# Patient Record
Sex: Male | Born: 1990 | Race: Black or African American | Hispanic: No | Smoking: Light tobacco smoker
Health system: Southern US, Community
[De-identification: ages and names within clinical notes are randomized; demographics above are authoritative.]

## PROBLEM LIST (undated history)

## (undated) DIAGNOSIS — J45909 Unspecified asthma, uncomplicated: Secondary | ICD-10-CM

## (undated) DIAGNOSIS — F209 Schizophrenia, unspecified: Secondary | ICD-10-CM

## (undated) DIAGNOSIS — F319 Bipolar disorder, unspecified: Secondary | ICD-10-CM

## (undated) DIAGNOSIS — F909 Attention-deficit hyperactivity disorder, unspecified type: Secondary | ICD-10-CM

## (undated) DIAGNOSIS — R569 Unspecified convulsions: Secondary | ICD-10-CM

## (undated) DIAGNOSIS — F329 Major depressive disorder, single episode, unspecified: Secondary | ICD-10-CM

## (undated) DIAGNOSIS — F32A Depression, unspecified: Secondary | ICD-10-CM

## (undated) DIAGNOSIS — R454 Irritability and anger: Secondary | ICD-10-CM

## (undated) HISTORY — PX: ABDOMINAL SURGERY: SHX537

## (undated) HISTORY — PX: NISSEN FUNDOPLICATION: SHX2091

## (undated) HISTORY — PX: STOMACH SURGERY: SHX791

## (undated) HISTORY — PX: TRACHEOSTOMY: SUR1362

---
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
## 2008-08-11 ENCOUNTER — Emergency Department (HOSPITAL_COMMUNITY): Admission: EM | Admit: 2008-08-11 | Discharge: 2008-08-11 | Payer: Self-pay | Admitting: Emergency Medicine

## 2008-11-22 ENCOUNTER — Ambulatory Visit (HOSPITAL_COMMUNITY): Admission: EM | Admit: 2008-11-22 | Discharge: 2008-11-22 | Payer: Self-pay | Admitting: *Deleted

## 2009-03-17 ENCOUNTER — Emergency Department (HOSPITAL_COMMUNITY): Admission: EM | Admit: 2009-03-17 | Discharge: 2009-03-17 | Payer: Self-pay | Admitting: Emergency Medicine

## 2009-06-16 ENCOUNTER — Ambulatory Visit: Payer: Self-pay | Admitting: Radiology

## 2009-06-16 ENCOUNTER — Emergency Department (HOSPITAL_BASED_OUTPATIENT_CLINIC_OR_DEPARTMENT_OTHER): Admission: EM | Admit: 2009-06-16 | Discharge: 2009-06-16 | Payer: Self-pay | Admitting: Emergency Medicine

## 2009-07-12 ENCOUNTER — Emergency Department (HOSPITAL_COMMUNITY): Admission: EM | Admit: 2009-07-12 | Discharge: 2009-07-12 | Payer: Self-pay | Admitting: Emergency Medicine

## 2009-07-24 ENCOUNTER — Emergency Department (HOSPITAL_COMMUNITY): Admission: EM | Admit: 2009-07-24 | Discharge: 2009-07-25 | Payer: Self-pay | Admitting: Emergency Medicine

## 2009-07-25 ENCOUNTER — Emergency Department (HOSPITAL_COMMUNITY): Admission: EM | Admit: 2009-07-25 | Discharge: 2009-07-26 | Payer: Self-pay | Admitting: Emergency Medicine

## 2009-07-26 ENCOUNTER — Observation Stay (HOSPITAL_COMMUNITY): Admission: EM | Admit: 2009-07-26 | Discharge: 2009-07-28 | Payer: Self-pay | Admitting: Emergency Medicine

## 2009-07-26 ENCOUNTER — Ambulatory Visit: Payer: Self-pay | Admitting: Internal Medicine

## 2009-08-01 ENCOUNTER — Emergency Department (HOSPITAL_COMMUNITY): Admission: EM | Admit: 2009-08-01 | Discharge: 2009-08-01 | Payer: Self-pay | Admitting: Emergency Medicine

## 2009-08-24 ENCOUNTER — Emergency Department (HOSPITAL_COMMUNITY): Admission: EM | Admit: 2009-08-24 | Discharge: 2009-08-24 | Payer: Self-pay | Admitting: Emergency Medicine

## 2009-10-31 ENCOUNTER — Inpatient Hospital Stay (HOSPITAL_COMMUNITY): Admission: EM | Admit: 2009-10-31 | Discharge: 2009-11-02 | Payer: Self-pay | Admitting: Emergency Medicine

## 2010-09-26 LAB — CBC
HCT: 41.1 % (ref 39.0–52.0)
HCT: 39.8 % (ref 39.0–52.0)
HCT: 36.6 % — ABNORMAL LOW (ref 39.0–52.0)
Hemoglobin: 13.5 g/dL (ref 13.0–17.0)
Hemoglobin: 12.9 g/dL — ABNORMAL LOW (ref 13.0–17.0)
Hemoglobin: 12.9 g/dL — ABNORMAL LOW (ref 13.0–17.0)
MCHC: 32.9 g/dL (ref 30.0–36.0)
MCHC: 32.6 g/dL (ref 30.0–36.0)
MCHC: 33.7 g/dL (ref 30.0–36.0)
MCV: 84.2 fL (ref 78.0–100.0)
MCV: 83.8 fL (ref 78.0–100.0)
MCV: 84.9 fL (ref 78.0–100.0)
Platelets: 203 10*3/uL (ref 150–400)
Platelets: 216 10*3/uL (ref 150–400)
RBC: 4.88 MIL/uL (ref 4.22–5.81)
RDW: 15.2 % (ref 11.5–15.5)
RDW: 15.3 % (ref 11.5–15.5)
RDW: 15.3 % (ref 11.5–15.5)
RDW: 15.5 % (ref 11.5–15.5)
WBC: 7.4 10*3/uL (ref 4.0–10.5)
WBC: 6.7 10*3/uL (ref 4.0–10.5)

## 2010-09-26 LAB — BASIC METABOLIC PANEL
BUN: 14 mg/dL (ref 6–23)
BUN: 8 mg/dL (ref 6–23)
CO2: 26 mEq/L (ref 19–32)
CO2: 25 mEq/L (ref 19–32)
Calcium: 9.4 mg/dL (ref 8.4–10.5)
Calcium: 8.9 mg/dL (ref 8.4–10.5)
Calcium: 9 mg/dL (ref 8.4–10.5)
Chloride: 107 mEq/L (ref 96–112)
Chloride: 104 mEq/L (ref 96–112)
Creatinine, Ser: 0.9 mg/dL (ref 0.4–1.5)
Creatinine, Ser: 0.99 mg/dL (ref 0.4–1.5)
GFR calc Af Amer: >60 mL/min (ref >60)
GFR calc Af Amer: >60 mL/min (ref >60)
GFR calc non Af Amer: >60 mL/min (ref >60)
Glucose, Bld: 91 mg/dL (ref 70–99)
Glucose, Bld: 95 mg/dL (ref 70–99)
Sodium: 141 mEq/L (ref 135–145)
Sodium: 139 mEq/L (ref 135–145)

## 2010-09-26 LAB — ETHANOL: Alcohol, Ethyl (B): 20 mg/dL — ABNORMAL HIGH (ref 0–10)

## 2010-09-26 LAB — HEMOCCULT GUIAC POC 1CARD (OFFICE)
Fecal Occult Bld: NEGATIVE
Fecal Occult Bld: POSITIVE
Fecal Occult Bld: NEGATIVE

## 2010-09-26 LAB — DIFFERENTIAL
Basophils Absolute: 0 10*3/uL (ref 0.0–0.1)
Basophils Absolute: 0 10*3/uL (ref 0.0–0.1)
Basophils Absolute: 0 10*3/uL (ref 0.0–0.1)
Basophils Relative: 0 % (ref 0–1)
Basophils Relative: 0 % (ref 0–1)
Basophils Relative: 0 % (ref 0–1)
Eosinophils Absolute: 0 10*3/uL (ref 0.0–0.7)
Eosinophils Absolute: 0 10*3/uL (ref 0.0–0.7)
Eosinophils Relative: 0 % (ref 0–5)
Monocytes Absolute: 0.9 10*3/uL (ref 0.1–1.0)
Monocytes Absolute: 0.5 10*3/uL (ref 0.1–1.0)
Monocytes Absolute: 0.7 10*3/uL (ref 0.1–1.0)
Monocytes Relative: 10 % (ref 3–12)
Monocytes Relative: 7 % (ref 3–12)
Neutro Abs: 4.7 10*3/uL (ref 1.7–7.7)
Neutrophils Relative %: 72 % (ref 43–77)

## 2010-09-26 LAB — RAPID URINE DRUG SCREEN, HOSP PERFORMED
Barbiturates: POSITIVE — AB
Benzodiazepines: NOT DETECTED
Cocaine: NOT DETECTED
Opiates: NOT DETECTED
Opiates: POSITIVE — AB
Tetrahydrocannabinol: POSITIVE — AB

## 2010-09-26 LAB — HEPATIC FUNCTION PANEL
AST: 16 U/L (ref 0–37)
Albumin: 3.3 g/dL — ABNORMAL LOW (ref 3.5–5.2)
Total Protein: 5.9 g/dL — ABNORMAL LOW (ref 6.0–8.3)

## 2010-09-26 LAB — HIV ANTIBODY (ROUTINE TESTING W REFLEX): HIV: NONREACTIVE

## 2010-09-26 LAB — COMPREHENSIVE METABOLIC PANEL
ALT: 11 U/L (ref 0–53)
Albumin: 3.7 g/dL (ref 3.5–5.2)
Alkaline Phosphatase: 65 U/L (ref 39–117)
Chloride: 105 mEq/L (ref 96–112)
Glucose, Bld: 85 mg/dL (ref 70–99)
Potassium: 3.6 mEq/L (ref 3.5–5.1)
Sodium: 138 mEq/L (ref 135–145)
Total Bilirubin: 0.5 mg/dL (ref 0.3–1.2)
Total Protein: 6.7 g/dL (ref 6.0–8.3)

## 2010-09-26 LAB — POCT I-STAT, CHEM 8
BUN: 10 mg/dL (ref 6–23)
BUN: 16 mg/dL (ref 6–23)
Calcium, Ion: 1.16 mmol/L (ref 1.12–1.32)
Chloride: 105 mEq/L (ref 96–112)
Chloride: 107 mEq/L (ref 96–112)
Creatinine, Ser: 0.9 mg/dL (ref 0.4–1.5)
Creatinine, Ser: 1.1 mg/dL (ref 0.4–1.5)
Potassium: 3.6 mEq/L (ref 3.5–5.1)
Sodium: 140 mEq/L (ref 135–145)
Sodium: 141 mEq/L (ref 135–145)
TCO2: 25 mmol/L (ref 0–100)
TCO2: 30 mmol/L (ref 0–100)

## 2010-09-26 LAB — URINALYSIS, ROUTINE W REFLEX MICROSCOPIC
Bilirubin Urine: NEGATIVE
Bilirubin Urine: NEGATIVE
Glucose, UA: NEGATIVE mg/dL
Hgb urine dipstick: NEGATIVE
Ketones, ur: NEGATIVE mg/dL
Ketones, ur: NEGATIVE mg/dL
Nitrite: NEGATIVE
Protein, ur: NEGATIVE mg/dL
Specific Gravity, Urine: 1.039 — ABNORMAL HIGH (ref 1.005–1.030)
Urobilinogen, UA: 1 mg/dL (ref 0.0–1.0)

## 2010-09-26 LAB — PROTIME-INR
Prothrombin Time: 12.9 seconds (ref 11.6–15.2)
Prothrombin Time: 14.4 seconds (ref 11.6–15.2)

## 2010-09-26 LAB — LIPASE, BLOOD: Lipase: 13 U/L (ref 11–59)

## 2010-09-26 LAB — URINE MICROSCOPIC-ADD ON

## 2010-09-26 LAB — HEPATITIS B SURFACE ANTIGEN: Hepatitis B Surface Ag: NEGATIVE

## 2010-09-28 LAB — CBC
HCT: 39.3 % (ref 39.0–52.0)
Hemoglobin: 12.4 g/dL — ABNORMAL LOW (ref 13.0–17.0)
Hemoglobin: 14 g/dL (ref 13.0–17.0)
Hemoglobin: 14.1 g/dL (ref 13.0–17.0)
Platelets: 243 10*3/uL (ref 150–400)
RBC: 4.4 MIL/uL (ref 4.22–5.81)
RBC: 4.95 MIL/uL (ref 4.22–5.81)
RDW: 14.5 % (ref 11.5–15.5)
RDW: 14.7 % (ref 11.5–15.5)
RDW: 14.8 % (ref 11.5–15.5)
WBC: 10.1 10*3/uL (ref 4.0–10.5)
WBC: 11.5 10*3/uL — ABNORMAL HIGH (ref 4.0–10.5)

## 2010-09-28 LAB — RAPID URINE DRUG SCREEN, HOSP PERFORMED
Amphetamines: NOT DETECTED
Cocaine: NOT DETECTED
Opiates: NOT DETECTED
Tetrahydrocannabinol: POSITIVE — AB

## 2010-09-28 LAB — DIFFERENTIAL
Basophils Absolute: 0 10*3/uL (ref 0.0–0.1)
Lymphocytes Relative: 26 % (ref 12–46)
Monocytes Absolute: 0.6 10*3/uL (ref 0.1–1.0)
Neutro Abs: 4.2 10*3/uL (ref 1.7–7.7)

## 2010-09-28 LAB — HEMOGLOBIN AND HEMATOCRIT, BLOOD
HCT: 41.8 % (ref 39.0–52.0)
Hemoglobin: 13.9 g/dL (ref 13.0–17.0)

## 2010-09-28 LAB — POCT I-STAT, CHEM 8
BUN: 8 mg/dL (ref 6–23)
Calcium, Ion: 1.07 mmol/L — ABNORMAL LOW (ref 1.12–1.32)
Creatinine, Ser: 0.9 mg/dL (ref 0.4–1.5)
TCO2: 23 mmol/L (ref 0–100)

## 2010-09-28 LAB — CROSSMATCH: ABO/RH(D): A POS

## 2010-09-28 LAB — ABO/RH: ABO/RH(D): A POS

## 2010-09-28 LAB — PROTIME-INR: INR: 1.05 (ref 0.00–1.49)

## 2010-09-28 LAB — APTT: aPTT: 25 seconds (ref 24–37)

## 2010-10-12 LAB — COMPREHENSIVE METABOLIC PANEL WITH GFR
AST: 49 U/L — ABNORMAL HIGH (ref 0–37)
Albumin: 3.9 g/dL (ref 3.5–5.2)
BUN: 10 mg/dL (ref 6–23)
Creatinine, Ser: 1.1 mg/dL (ref 0.4–1.5)
GFR calc Af Amer: >60 mL/min (ref >60)
Total Protein: 6.6 g/dL (ref 6.0–8.3)

## 2010-10-12 LAB — COMPREHENSIVE METABOLIC PANEL
ALT: 18 U/L (ref 0–53)
Alkaline Phosphatase: 64 U/L (ref 39–117)
CO2: 30 mEq/L (ref 19–32)
Calcium: 9.1 mg/dL (ref 8.4–10.5)
Chloride: 106 mEq/L (ref 96–112)
GFR calc non Af Amer: >60 mL/min (ref >60)
Glucose, Bld: 76 mg/dL (ref 70–99)
Potassium: 4.4 mEq/L (ref 3.5–5.1)
Sodium: 143 mEq/L (ref 135–145)
Total Bilirubin: 0.4 mg/dL (ref 0.3–1.2)

## 2010-10-12 LAB — CBC
HCT: 43.5 % (ref 39.0–52.0)
Hemoglobin: 14.1 g/dL (ref 13.0–17.0)
MCHC: 32.4 g/dL (ref 30.0–36.0)
MCV: 83.5 fL (ref 78.0–100.0)
Platelets: 288 K/uL (ref 150–400)
RBC: 5.21 MIL/uL (ref 4.22–5.81)
RDW: 15.7 % — ABNORMAL HIGH (ref 11.5–15.5)
WBC: 9 K/uL (ref 4.0–10.5)

## 2010-10-12 LAB — LIPASE, BLOOD: Lipase: 36 U/L (ref 23–300)

## 2010-10-12 LAB — HEMOCCULT GUIAC POC 1CARD (OFFICE): Fecal Occult Bld: NEGATIVE

## 2010-10-15 LAB — URINALYSIS, ROUTINE W REFLEX MICROSCOPIC
Bilirubin Urine: NEGATIVE
Hgb urine dipstick: NEGATIVE
Ketones, ur: NEGATIVE mg/dL
Specific Gravity, Urine: 1.016 (ref 1.005–1.030)
Urobilinogen, UA: 0.2 mg/dL (ref 0.0–1.0)
pH: 7.5 (ref 5.0–8.0)

## 2010-10-15 LAB — URINE MICROSCOPIC-ADD ON

## 2010-10-19 LAB — COMPREHENSIVE METABOLIC PANEL
ALT: 15 U/L (ref 0–53)
AST: 21 U/L (ref 0–37)
Alkaline Phosphatase: 75 U/L (ref 39–117)
CO2: 27 mEq/L (ref 19–32)
Chloride: 106 mEq/L (ref 96–112)
GFR calc Af Amer: >60 mL/min (ref >60)
GFR calc non Af Amer: >60 mL/min (ref >60)
Sodium: 139 mEq/L (ref 135–145)
Total Bilirubin: 0.5 mg/dL (ref 0.3–1.2)

## 2010-10-19 LAB — LIPASE, BLOOD: Lipase: 15 U/L (ref 11–59)

## 2010-10-19 LAB — DIFFERENTIAL
Basophils Absolute: 0 10*3/uL (ref 0.0–0.1)
Eosinophils Absolute: 0 10*3/uL (ref 0.0–0.7)
Eosinophils Relative: 0 % (ref 0–5)

## 2010-10-19 LAB — CBC
RBC: 4.64 MIL/uL (ref 4.22–5.81)
WBC: 8.5 10*3/uL (ref 4.0–10.5)

## 2010-10-26 LAB — CBC
HCT: 45.4 % (ref 39.0–52.0)
MCHC: 32.4 g/dL (ref 30.0–36.0)
MCV: 85 fL (ref 78.0–100.0)
Platelets: 249 10*3/uL (ref 150–400)
RDW: 14.9 % (ref 11.5–15.5)

## 2010-10-26 LAB — COMPREHENSIVE METABOLIC PANEL
Albumin: 3.9 g/dL (ref 3.5–5.2)
BUN: 9 mg/dL (ref 6–23)
Calcium: 9.3 mg/dL (ref 8.4–10.5)
Creatinine, Ser: 0.99 mg/dL (ref 0.4–1.5)
Potassium: 3.9 mEq/L (ref 3.5–5.1)
Total Protein: 6.5 g/dL (ref 6.0–8.3)

## 2010-10-26 LAB — URINALYSIS, ROUTINE W REFLEX MICROSCOPIC
Nitrite: NEGATIVE
Urobilinogen, UA: 1 mg/dL (ref 0.0–1.0)
pH: 5.5 (ref 5.0–8.0)

## 2010-10-26 LAB — DIFFERENTIAL
Lymphocytes Relative: 9 % — ABNORMAL LOW (ref 12–46)
Lymphs Abs: 0.9 10*3/uL (ref 0.7–4.0)
Monocytes Absolute: 0.4 10*3/uL (ref 0.1–1.0)
Monocytes Relative: 4 % (ref 3–12)
Neutro Abs: 7.8 10*3/uL — ABNORMAL HIGH (ref 1.7–7.7)

## 2010-10-26 LAB — ETHANOL: Alcohol, Ethyl (B): <5 mg/dL (ref 0–10)

## 2010-10-26 LAB — RAPID URINE DRUG SCREEN, HOSP PERFORMED
Benzodiazepines: NOT DETECTED
Cocaine: NOT DETECTED

## 2010-10-26 LAB — URINE MICROSCOPIC-ADD ON
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
## 2010-11-23 NOTE — H&P (Signed)
NAME:  Jay Moran, PANNING.:  [id number]   MEDICAL RECORD NO.:  [id number]          PATIENT TYPE:  EMS   LOCATION:  ED                           FACILITY[facility]  [facility]   PHYSICIAN:  Richarda Overlie, MD       DATE OF BIRTH:  14-Oct-1990   DATE OF ADMISSION:  08/11/2008  DATE OF DISCHARGE:                              HISTORY & PHYSICAL   SUBJECTIVE:  This is a 20 year old male who presents to the ER with a  chief complaint of abdominal pain.  The patient has remote history of  gastroesophageal reflux disease with Nissen fundoplication at [facility] when he was about 35 months old.  No records are available at this  point for review.  However, the records have been requested.  The  patient presented to the ER today with a chief complaint of epigastric  abdominal pain for the last 24 hours, spasmodic in nature, associated  with recurrent nausea and vomiting.  The patient states that he has had  at least 8-12 episodes of vomiting.  Two of those episodes were coffee-  ground emesis.  The patient also felt somewhat dizzy this morning but no  episodes of syncope or presyncope noted according to the family.  The  patient has not noticed any diarrhea or constipation, no fever, no blood  in this stool, no black tarry stools.  He denies any chest pain,  shortness of breath or wheezing, and his asthma has been under control.  He has a history of alcohol abuse and narcotic dependence.  However,  patient denies any history of recent alcohol use and states that he has  been sober for the last several months.  He denies any history of  urinary urgency, frequency or dysuria.  He denies any recent weight loss  or weight gain.   PAST MEDICAL HISTORY:  1. Asthma.  2. History of respiratory failure.   PAST SURGICAL HISTORY:  1. History of Nissen fundoplication about 18 years ago at [facility].   SOCIAL HISTORY:  History of alcohol abuse.  Patient states he has been  sober  for the last week.   ALLERGIES:  No known drug allergies.   HOME MEDICATIONS:  None.   REVIEW OF SYSTEMS:  Positive for epigastric pain, spasmodic in nature,  with 2 episodes of coffee-ground emesis and some dizziness.   PHYSICAL EXAMINATION:  VITAL SIGNS:  Blood pressure 144/59, pulse of 81,  respirations 20, and 100% on room air.  HEENT:  Pupils are equal and reactive.  Extraocular movements are  intact.  LUNGS:  Clear to auscultation bilaterally, no wheezes, no crackles, no  rhonchi.  CARDIOVASCULAR:  Regular rate and rhythm.  NECK:  Supple with full range of motion without any JVD.  ABDOMEN:  Midline incision which is well healed with some epigastric  tenderness, hypoactive bowel sounds without any rebound or guarding.  EXTREMITIES:  Normal.  NEUROLOGIC:  Cranial nerves II-XII are grossly intact, motor intact in  all 4 extremities.  PSYCHIATRIC:  Appropriate mood and affect.   LABORATORIES:  WBC 9.1, hemoglobin 14.7, hematocrit  45.4, platelet count  of 249, sodium 139, potassium 3.9, glucose 103, creatinine 199, lipase  25.  Urine drug screen positive for tetrahydrocannabinol.  Urine  analysis with specific gravity of 1.039, trace ketones, negative for  nitrates, small amount of leukocyte esterase.   Abdominal series shows prominent proximal small bowel loops consistent  with partial small-bowel obstruction.  CT scan of the abdomen and pelvis  shows dilated proximal small loops of bowel.  Distal small bowel loops  are less dilated and fluid filled.  Findings are concerning for small-  bowel obstruction.  No transition point or etiology can be identified.   ASSESSMENT/PLAN:  1. Partial small-bowel obstruction with history of Nissen      fundoplication also associated with some coffee-ground emesis. This      could be related to either ileus due to viral gastroeneteritis or      SBO related to adhesions from previous surgeries. We will keep the      patient n.p.o.  Dr.  Baruch Merl, general surgery, has been      notified and requested to see patient in AM .  Because of the      patient's history of coffee-ground emesis, we will hold off on a      nasogastric tube and just monitor.  We will start the patient on      antiemetics, namely with Phenergan and Zofran as needed.  We will      repeat an abdominal series  in the morning to monitor resolution of      partial small-bowel obstruction.  The patient has been advised to      stay n.p.o.  The patient has a history of narcotic abuse.  However,      his urine drug analysis was negative for opiates.  Therefore,      narcotics are probably not a contributing factor.  2. Coffee-ground emesis.  Patient has a history of gastroesophageal      reflux disease and certainly could have gastric outlet obstruction      or pyloric channel ulcer which could be contributing to his coffee-      ground emesis.  If the patient has any recurrence of upper      gastrointestinal bleeding, we will notify gastroenterology, and he      may need an endoscopy.  We will start the patient on 40 mg of      intravenous q.12 h. of Protonix.  Refrain from putting in a      nasogastric tube at this point.  3. History of asthma.  Continue with p.r.n. albuterol.  Patient's      respiratory status appears to be stable at this point.   The patient is a full code.   addendum: patient felt that most of his symptoms had resolved requested  to be discharged from ER ,ER attending was notified , so patient was not  admitted to [facility] .      Richarda Overlie, MD  Electronically Signed     NA/MEDQ  D:  08/11/2008  T:  08/11/2008  Job:  [id number]

## 2010-11-23 NOTE — Consult Note (Signed)
NAME:  MARLEY, CHARLOT NO.:  [id number]   MEDICAL RECORD NO.:  [id number]          PATIENT TYPE:  INP   LOCATION:  0111                         FACILITY:  [facility]   PHYSICIAN:  Alfonse Ras, MD   DATE OF BIRTH:  [birth year]   DATE OF CONSULTATION:  08/11/2008  DATE OF DISCHARGE:                                 CONSULTATION   REASON FOR CONSULTATION:  Abdominal pain, questionable partial small  bowel obstruction.   HISTORY OF PRESENT ILLNESS:  The patient is a 20 year old black male who  underwent Nissen fundoplication as a child.  He does not remember any  other previous abdominal surgeries, and he is not accompanied by any  family members.  He began with abdominal pain last evening which  actually has almost completely resolved while here in the emergency  room, and he has had normal bowel movement.  He has no further nausea,  and has not vomited since this morning.  His white count is normal at  9000.  His CT scan does show ileus versus proximal small bowel  obstruction.  All his other labs are normal.   PAST MEDICAL HISTORY:  Significant for drug and alcohol use; however,  the last time he used any drugs was approximately 2 days ago when he  said he smoked a blunt.  He denies alcohol use from last 3 days.  He  also has a history of asthma.  He takes no medications.   PHYSICAL EXAMINATION:  VITAL SIGNS:  His temperature is 97, blood  pressures 111/66, and heart rate 77.  GENERAL:  He is sitting on the edge of the bed and quite anxious to go  home denying any abdominal pain.  His friend is at the bedside.  HEENT EXAM:  Benign.  Normocephalic, atraumatic.  ABDOMEN:  Soft, minimally tender in the right side of the abdomen with  normal bowel sounds.   IMPRESSION:  Probable gastroenteritis as his girlfriend has also had  similar symptoms in the last 48 hours.   PLAN:  [facility] has planned to admit the patient.  I am going to talk  to them.  I am not certain  he needs admitted as his symptoms have  resolved at the time that I have seen the patient; however, if he  remains in the hospital, I will be happy to see him in followup.      Alfonse Ras, MD  Electronically Signed     KRE/MEDQ  D:  08/11/2008  T:  08/12/2008  Job:  [id number]
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
## 2011-04-07 LAB — COMPREHENSIVE METABOLIC PANEL
ALT: 11
Albumin: 3.7
Alkaline Phosphatase: 66
BUN: 8
Chloride: 111
Glucose, Bld: 108 — ABNORMAL HIGH
Potassium: 3.3 — ABNORMAL LOW
Sodium: 142
Total Bilirubin: 0.6

## 2011-04-07 LAB — ETHANOL: Alcohol, Ethyl (B): 169 — ABNORMAL HIGH

## 2011-04-07 LAB — CBC
HCT: 38.1 — ABNORMAL LOW
Hemoglobin: 12.7 — ABNORMAL LOW
WBC: 5.1

## 2011-04-07 LAB — RAPID URINE DRUG SCREEN, HOSP PERFORMED
Amphetamines: NOT DETECTED
Opiates: NOT DETECTED
Tetrahydrocannabinol: POSITIVE — AB

## 2011-04-07 LAB — DIFFERENTIAL
Basophils Absolute: 0
Basophils Relative: 0
Eosinophils Absolute: 0
Monocytes Absolute: 0.4
Neutro Abs: 2.8
Neutrophils Relative %: 56

## 2011-04-07 LAB — ACETAMINOPHEN LEVEL: Acetaminophen (Tylenol), Serum: <10 — ABNORMAL LOW

## 2011-04-19 LAB — URINE MICROSCOPIC-ADD ON

## 2011-04-19 LAB — URINALYSIS, ROUTINE W REFLEX MICROSCOPIC
Hgb urine dipstick: NEGATIVE
Leukocytes, UA: NEGATIVE
Nitrite: NEGATIVE
Protein, ur: 30 — AB
Specific Gravity, Urine: 1.037 — ABNORMAL HIGH
Urobilinogen, UA: 2 — ABNORMAL HIGH

## 2011-08-23 IMAGING — CT CT PELVIS W/O CM
2 of 4 series · 17 of 46 positions shown, 19 images · non-contrast
Comparison: CT 08/11/2008

CT ABDOMEN

CLINICAL DATA: Vomiting blood.  Rectal bleeding.  Limited IV
access.

CT OF THE ABDOMEN AND PELVIS WITHOUT CONTRAST
TECHNIQUE: Multidetector CT imaging was performed through the
abdomen and pelvis to include the urinary tract.

[Series 2: abd/pelvis 5.0 b31f · axial · 0.61mm/px · z∈[+840,+1220]mm · 14 of 84 slices shown, 16 images]
[im 4/84  soft-tissue]
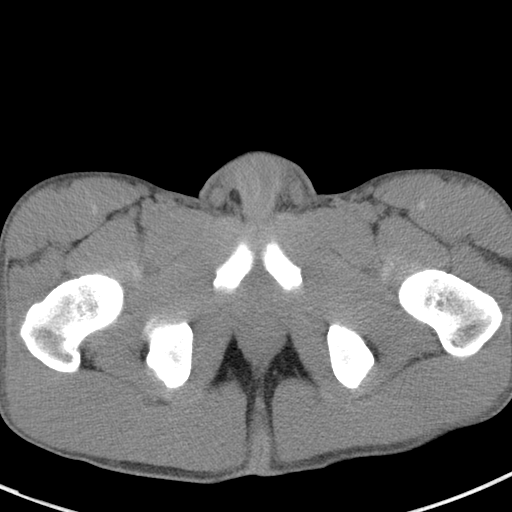
[im 4/84  bone]
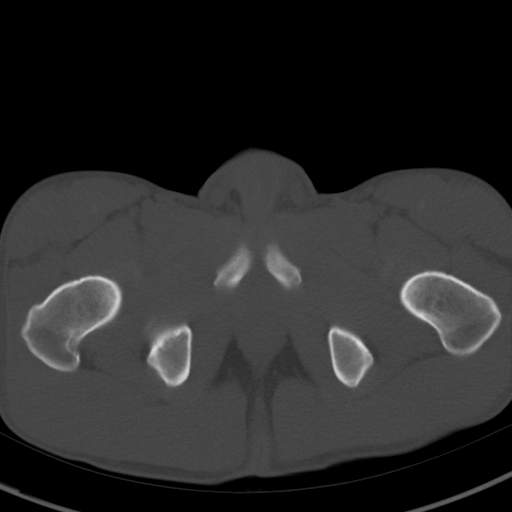
[im 10/84  soft-tissue]
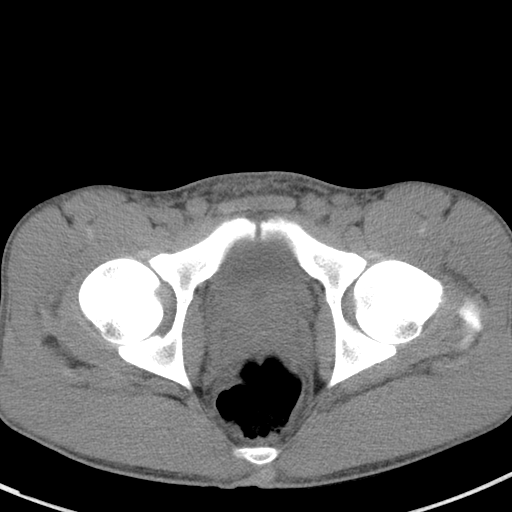
[im 17/84  soft-tissue]
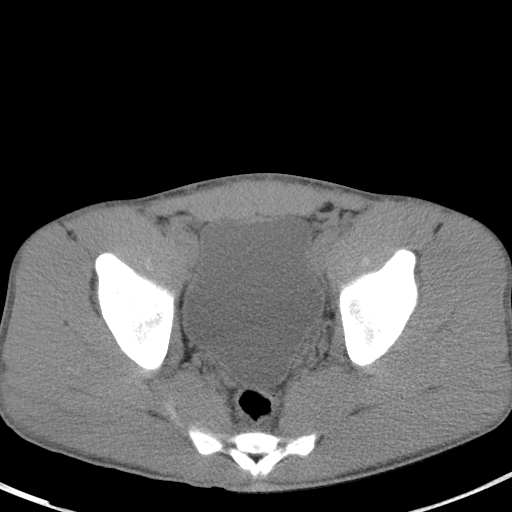
[im 24/84  soft-tissue]
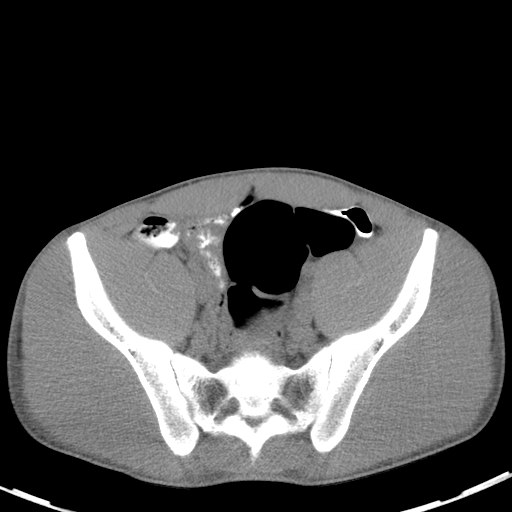
[im 27/84  soft-tissue]
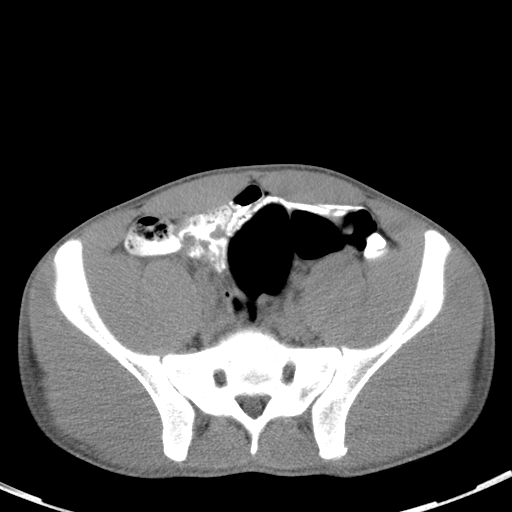
[im 34/84  soft-tissue]
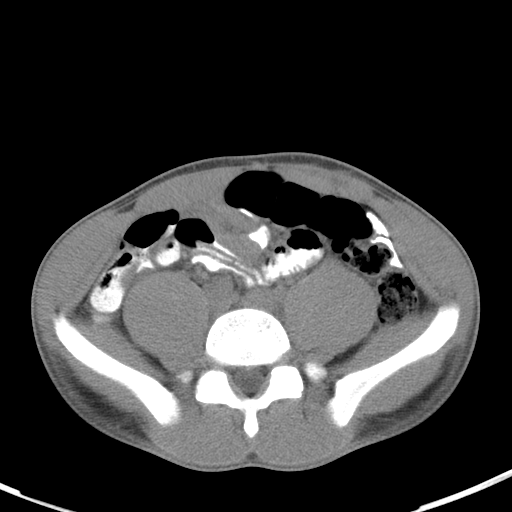
[im 40/84  soft-tissue]
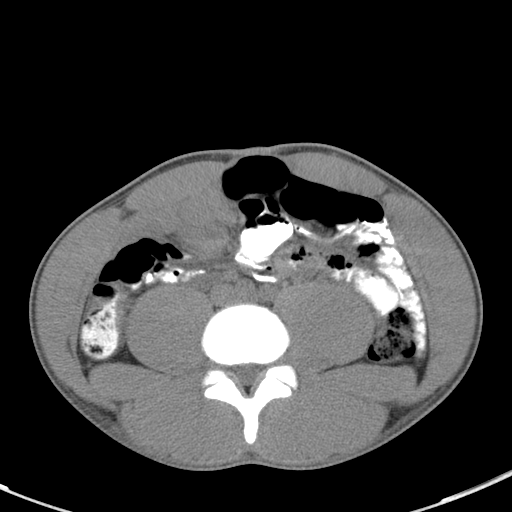
[im 44/84  soft-tissue]
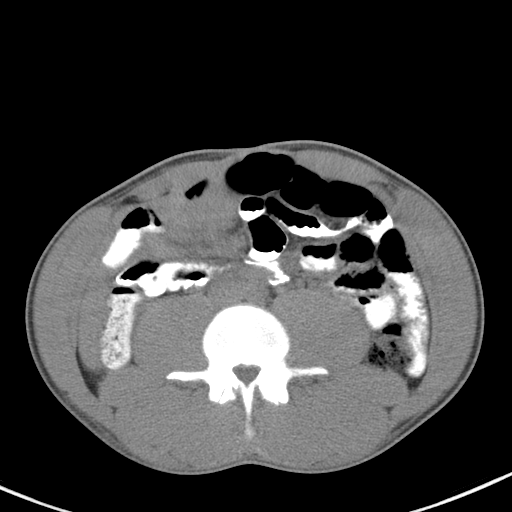
[im 50/84  soft-tissue]
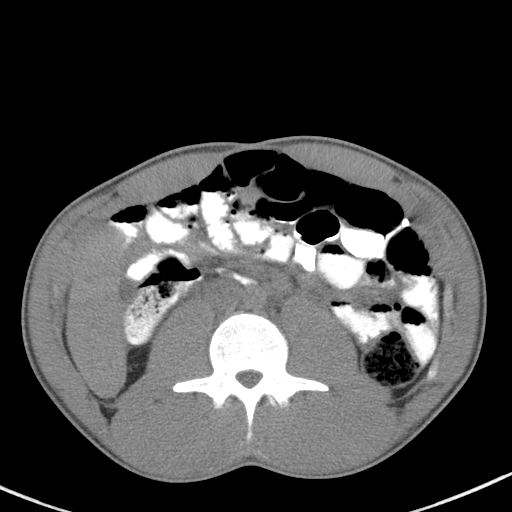
[im 50/84  bone]
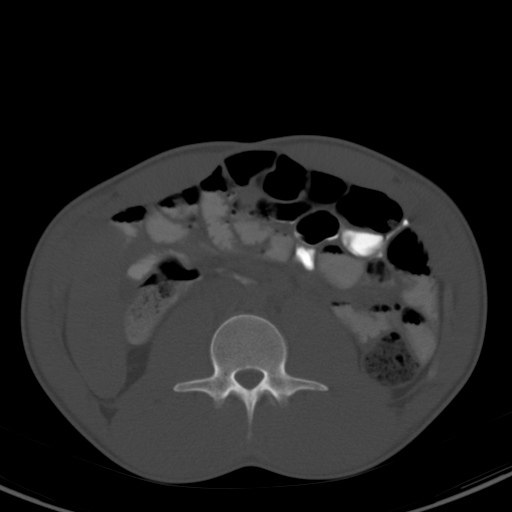
[im 57/84  soft-tissue]
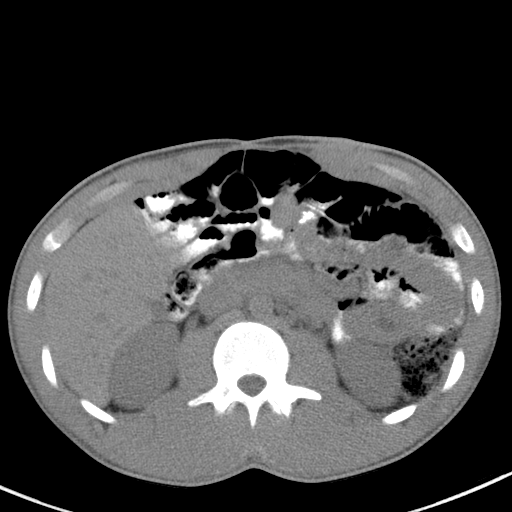
[im 64/84  soft-tissue]
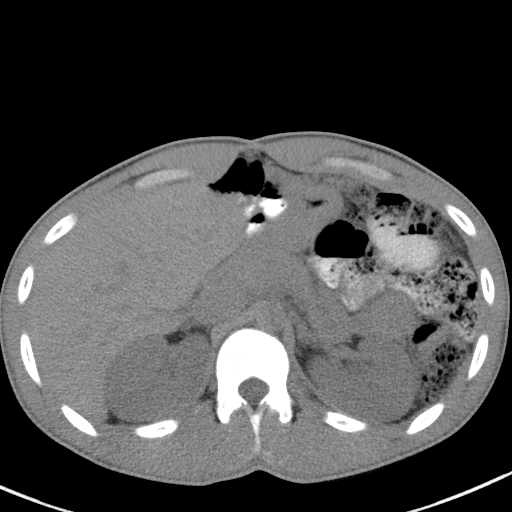
[im 67/84  soft-tissue]
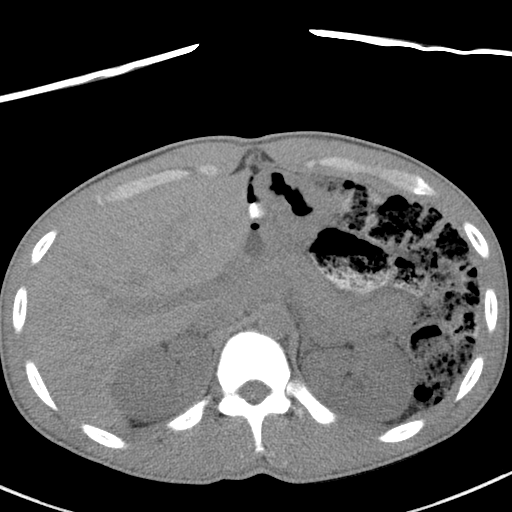
[im 74/84  soft-tissue]
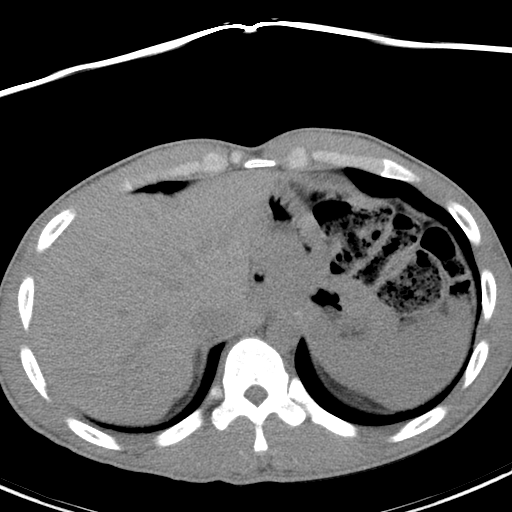
[im 80/84  soft-tissue]
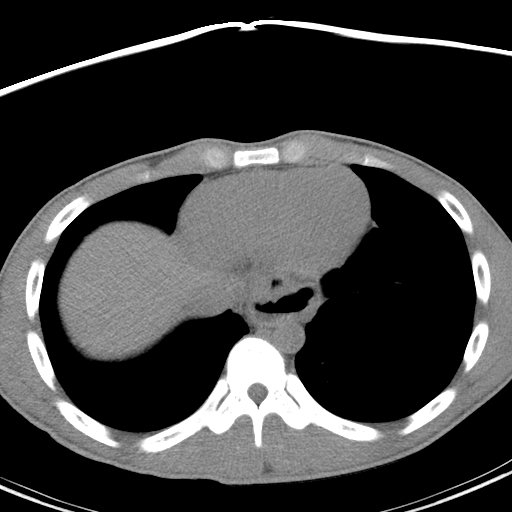

[Series 5: abd/pelvis 3.0 coronal · coronal · 0.63mm/px · 3 of 72 slices shown]
[im 24/72  soft-tissue]
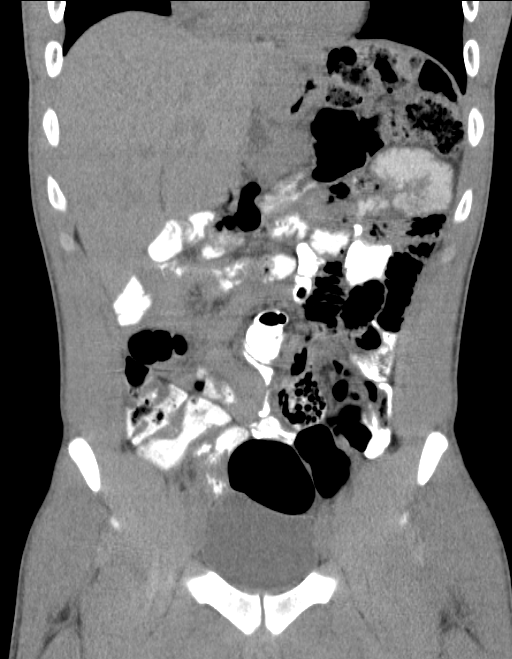
[im 32/72  soft-tissue]
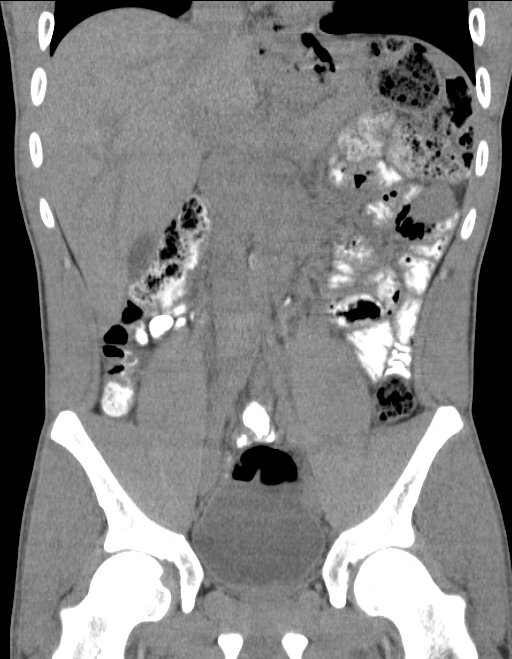
[im 40/72  soft-tissue]
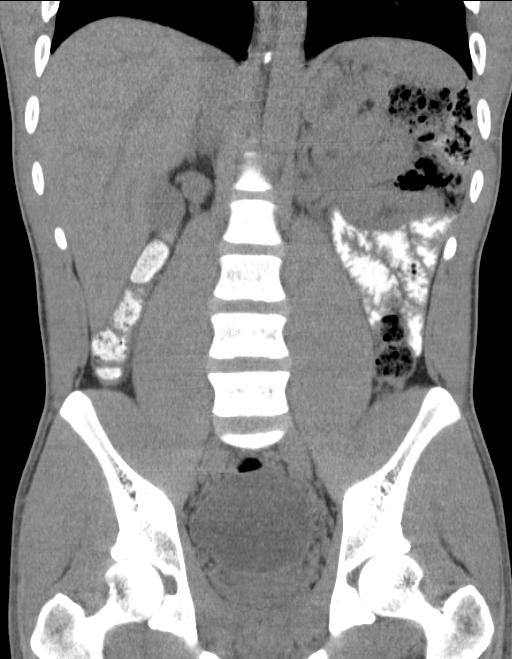

[17 of 46 positions shown; findings below may reference images not displayed]

FINDINGS: Negative liver, spleen, kidneys, and adrenal glands.  No
definite pancreatic abnormality.  No definite abnormal bowel wall
thickening.  No free fluid.  No free air.
IMPRESSION: No definite acute abdominal findings.

CT PELVIS
FINDINGS: Normal bladder.  Prostate gland and seminal vesicles
unremarkable.  No pelvic mass or free fluid.  No unusual bowel wall
thickening.
IMPRESSION: No definite acute pelvic abnormality.

## 2016-08-06 ENCOUNTER — Encounter (HOSPITAL_COMMUNITY): Payer: Self-pay | Admitting: Vascular Surgery

## 2016-08-06 ENCOUNTER — Emergency Department (HOSPITAL_COMMUNITY)
Admission: EM | Admit: 2016-08-06 | Discharge: 2016-08-06 | Disposition: A | Discharge status: Home / Self Care | Payer: Self-pay | Attending: Emergency Medicine | Admitting: Emergency Medicine

## 2016-08-06 DIAGNOSIS — R4182 Altered mental status, unspecified: Secondary | ICD-10-CM | POA: Insufficient documentation

## 2016-08-06 DIAGNOSIS — F909 Attention-deficit hyperactivity disorder, unspecified type: Secondary | ICD-10-CM | POA: Insufficient documentation

## 2016-08-06 DIAGNOSIS — R251 Tremor, unspecified: Secondary | ICD-10-CM | POA: Insufficient documentation

## 2016-08-06 HISTORY — DX: Major depressive disorder, single episode, unspecified: F32.9

## 2016-08-06 HISTORY — DX: Bipolar disorder, unspecified: F31.9

## 2016-08-06 HISTORY — DX: Attention-deficit hyperactivity disorder, unspecified type: F90.9

## 2016-08-06 HISTORY — DX: Schizophrenia, unspecified: F20.9

## 2016-08-06 HISTORY — DX: Depression, unspecified: F32.A

## 2016-08-06 HISTORY — DX: Irritability and anger: R45.4

## 2016-08-06 MED ORDER — LORAZEPAM 2 MG/ML IJ SOLN
1.0000 mg | Freq: Once | INTRAMUSCULAR | Status: AC
  Administered 2016-08-06: 1 mg via INTRAVENOUS
  Filled 2016-08-06: qty 1

## 2016-08-06 MED ORDER — AMMONIA AROMATIC IN INHA
0.3000 mL | Freq: Once | RESPIRATORY_TRACT | Status: DC
  Filled 2016-08-06: qty 10

## 2016-08-06 NOTE — ED Provider Notes (Signed)
[facility]-EMERGENCY DEPT Provider Note  CSN: [id number] Arrival date & time: 08/06/16  1456  History   Chief Complaint Chief Complaint  Patient presents with  . Altered Mental Status   HPI Jay Moran is a 26 y.o. male.  The history is provided by the patient and medical records. No language interpreter was used.  Illness  This is a new problem. The current episode started less than 1 hour ago. The problem occurs constantly. The problem has been resolved. Pertinent negatives include no chest pain, no abdominal pain, no headaches and no shortness of breath. Nothing aggravates the symptoms. Nothing relieves the symptoms.    Past Medical History:  Diagnosis Date  . ADHD   . Bipolar 1 disorder (HCC)   . Depression   . Excessive anger   . Schizophrenia (HCC)    There are no active problems to display for this patient.  Past Surgical History:  Procedure Laterality Date  . ABDOMINAL SURGERY    . NISSEN FUNDOPLICATION    . TRACHEOSTOMY     as a child    Home Medications    Prior to Admission medications   Not on File   Family History No family history on file.  Social History Social History  Substance Use Topics  . Smoking status: Not on file  . Smokeless tobacco: Not on file  . Alcohol use Not on file    Allergies   Patient has no allergy information on record.  Review of Systems Review of Systems  Respiratory: Negative for shortness of breath.   Cardiovascular: Negative for chest pain.  Gastrointestinal: Negative for abdominal pain.  Musculoskeletal: Positive for myalgias.  Neurological: Positive for tremors. Negative for headaches.  All other systems reviewed and are negative.   Physical Exam Updated Vital Signs BP 121/68   Pulse 97   Temp 98.7 F (37.1 C)   Resp 19   SpO2 100%   Physical Exam  Constitutional: He is oriented to person, place, and time. He appears well-developed and well-nourished. No distress.  HENT:  Head: Normocephalic and  atraumatic.  Eyes: EOM are normal. Pupils are equal, round, and reactive to light.  Neck: Normal range of motion. Neck supple.  Cardiovascular: Normal rate, regular rhythm and normal heart sounds.   Pulmonary/Chest: Effort normal and breath sounds normal.  Abdominal: Soft. Bowel sounds are normal. He exhibits no distension. There is no tenderness.  Musculoskeletal: Normal range of motion.  Neurological: He is alert and oriented to person, place, and time.  Alert and oriented 4, cranial nerves II through XII intact, speech fluent, coordination normal, gait unremarkable, no pronator drift, strength 5/5 throughout, sensation grossly normal  Skin: Skin is warm and dry. Capillary refill takes less than 2 seconds. He is not diaphoretic.  Nursing note and vitals reviewed.   ED Treatments / Results  Labs (all labs ordered are listed, but only abnormal results are displayed) Labs Reviewed - No data to display  EKG  EKG Interpretation  Date/Time:  Saturday August 06 2016 15:00:52 EST Ventricular Rate:  65 PR Interval:    QRS Duration: 96 QT Interval:  399 QTC Calculation: 415 R Axis:   77 Text Interpretation:  Sinus rhythm Early repolarization pattern Artifact Abnormal ekg Confirmed by Gerhard Munch  MD [phone]) on 08/06/2016 3:24:27 PM      Radiology No results found.  Procedures Procedures (including critical care time)  Medications Ordered in ED Medications  LORazepam (ATIVAN) injection 1 mg (1 mg Intravenous Given 08/06/16  1617)    Initial Impression / Assessment and Plan / ED Course  I have reviewed the triage vital signs and the nursing notes.  26 y.o. male with above stated PMHx, HPI, and physical. PMHx of bipolar, schizophrenia, ADHD. Recently incarcerated. Night prior he took angel dust (1 gram), molly (1 gram), percocet (5), zanax (6), liquid codine then morning of incarceration he took 1.5 grams of molly dipped in liquid codeine. This afternoon he was being taken down  for a medical examination and had an episode of generalized shaking (non-rhythmic) and decreased responsiveness (seconds to minutes). No confusion following. EMS called. There was no evidence of cyanosis, hypoxia, hypertension, severe hypertension, or further shaking activity en route. EMS did not administer any antiepileptics.  Patient is currently complaining of generalized body aches. Alert oriented 3, nonfocal neuro exam. Patient states this happens when he does not take his home medications. Contacted family members as well as local pharmacies but no evidence of patient taking any medications. EKG showing no ST elevation/depression or T-wave inversions or interval abnormalities or dysrhythmias. Point care glucose within normal limits. Doubt seizure given lack of tonic or rhythmic shaking or tongue biting or urinary incontinence or postictal period. This has occurred while patient had just presented to jail with reportedly long sentence. Suspect malingering versus pseudoseizure. Patient will see medical writer in jail upon arrival back.  Laboratory results were personally reviewed by myself and used in the medical decision making of this patient's treatment and disposition.  Pt discharged into police custody in stable condition. Strict ED return precautions dicussed. Pt understands and agrees with the plan and has no further questions or concerns.   Pt care discussed with and followed by my attending, Dr. Gerhard Munchobert Lockwood  Angelina Okyan Heylee Tant, MD Pager [phone]  Final Clinical Impressions(s) / ED Diagnoses   Final diagnoses:  Episode of shaking   New Prescriptions There are no discharge medications for this patient.    Angelina Okyan Jarious Lyon, MD 08/07/16 [id number]    Gerhard Munchobert Lockwood, MD 08/09/16 [phone]

## 2016-08-06 NOTE — ED Notes (Signed)
Twitching has stopped. No post ictal period. Patient following commands. No oral trauma or incontinence noted. Pt able to report his medical hx appropriately.

## 2016-08-06 NOTE — ED Notes (Addendum)
Spoke with sister in an attempt to get patient's medication history. She lives in [place] and is not sure of his medications or any pharmacy he might use. Called local [facility] and [facility] they have no record of him in any of their systems in the [place] system. ED resident made aware.

## 2016-08-06 NOTE — ED Notes (Addendum)
Pt now alert and talking. sts he took his prescription adderal around 5am (1/26) "angel dust (1 gram), molly (1 gram), percocet (5), zanax (6), liquid codine," around 2pm on 01/26.  pt also sts "he did 1.5 grams of molly dipped in liquid codine in his jail cell around approx 6am today" Pt sts he has a mental illness hx

## 2016-08-06 NOTE — ED Triage Notes (Signed)
Pt reports to the ED via [facility] for eval of altered mental status. Pt was taken to jail last night after he used Molly, Vicodin, and Xanax tid for an indeterminate amount of time. He was on the watch list for withdrawal and he was going down to medical to be checked out and he became unresponsive. When EMS arrived he was talking and following commands but his extremities started jerking involuntarily. Pt was loaded into the truck where he became unresponsive for some time and stopped breathing. EMS bagged him and he started to come around. Pt arousable to verbal stimuli but disoriented.

## 2016-08-06 NOTE — ED Notes (Signed)
RN called to room because patient was having jerking episodes. Upon entering the room patient noted to have extremity twitching and was foaming at this mouth. Eyes open but patient not responding to verbal stimuli. When arm lifted above head patient does not stop it from falling. EDP informed an 1 mg of Ativan ordered. VSS throughout the episode. Airway maintained. Oral suctioning performed intermittently.

## 2019-08-05 ENCOUNTER — Other Ambulatory Visit: Payer: Self-pay

## 2019-08-05 ENCOUNTER — Emergency Department (HOSPITAL_BASED_OUTPATIENT_CLINIC_OR_DEPARTMENT_OTHER): Admission: EM | Admit: 2019-08-05 | Discharge: 2019-08-05 | Discharge status: Absent without leave | Payer: Self-pay

## 2019-10-17 ENCOUNTER — Inpatient Hospital Stay
Admission: EM | Admit: 2019-10-17 | Discharge: 2019-10-19 | DRG: 917 | Disposition: A | Discharge status: Home / Self Care | Payer: Self-pay | Attending: Internal Medicine | Admitting: Internal Medicine

## 2019-10-17 ENCOUNTER — Emergency Department: Payer: Self-pay

## 2019-10-17 ENCOUNTER — Encounter: Payer: Self-pay | Admitting: Emergency Medicine

## 2019-10-17 DIAGNOSIS — G9341 Metabolic encephalopathy: Secondary | ICD-10-CM | POA: Diagnosis present

## 2019-10-17 DIAGNOSIS — Z20822 Contact with and (suspected) exposure to covid-19: Secondary | ICD-10-CM | POA: Diagnosis present

## 2019-10-17 DIAGNOSIS — R401 Stupor: Secondary | ICD-10-CM

## 2019-10-17 DIAGNOSIS — R402312 Coma scale, best motor response, none, at arrival to emergency department: Secondary | ICD-10-CM | POA: Diagnosis present

## 2019-10-17 DIAGNOSIS — E875 Hyperkalemia: Secondary | ICD-10-CM | POA: Diagnosis present

## 2019-10-17 DIAGNOSIS — N390 Urinary tract infection, site not specified: Secondary | ICD-10-CM | POA: Diagnosis present

## 2019-10-17 DIAGNOSIS — T17918A Gastric contents in respiratory tract, part unspecified causing other injury, initial encounter: Secondary | ICD-10-CM | POA: Diagnosis present

## 2019-10-17 DIAGNOSIS — R402112 Coma scale, eyes open, never, at arrival to emergency department: Secondary | ICD-10-CM | POA: Diagnosis present

## 2019-10-17 DIAGNOSIS — T17908A Unspecified foreign body in respiratory tract, part unspecified causing other injury, initial encounter: Secondary | ICD-10-CM | POA: Diagnosis present

## 2019-10-17 DIAGNOSIS — T407X1A Poisoning by cannabis (derivatives), accidental (unintentional), initial encounter: Principal | ICD-10-CM | POA: Diagnosis present

## 2019-10-17 DIAGNOSIS — A749 Chlamydial infection, unspecified: Secondary | ICD-10-CM | POA: Diagnosis present

## 2019-10-17 DIAGNOSIS — Y900 Blood alcohol level of less than 20 mg/100 ml: Secondary | ICD-10-CM | POA: Diagnosis present

## 2019-10-17 DIAGNOSIS — R402212 Coma scale, best verbal response, none, at arrival to emergency department: Secondary | ICD-10-CM | POA: Diagnosis present

## 2019-10-17 DIAGNOSIS — R200 Anesthesia of skin: Secondary | ICD-10-CM | POA: Diagnosis present

## 2019-10-17 DIAGNOSIS — A549 Gonococcal infection, unspecified: Secondary | ICD-10-CM | POA: Diagnosis present

## 2019-10-17 DIAGNOSIS — Z765 Malingerer [conscious simulation]: Secondary | ICD-10-CM

## 2019-10-17 DIAGNOSIS — J9600 Acute respiratory failure, unspecified whether with hypoxia or hypercapnia: Secondary | ICD-10-CM | POA: Diagnosis present

## 2019-10-17 LAB — CBC WITH DIFFERENTIAL/PLATELET
Abs Immature Granulocytes: 0.02 10*3/uL (ref 0.00–0.07)
Basophils Absolute: 0 10*3/uL (ref 0.0–0.1)
Basophils Relative: 0 %
Eosinophils Absolute: 0 10*3/uL (ref 0.0–0.5)
Eosinophils Relative: 0 %
HCT: 46.7 % (ref 39.0–52.0)
Hemoglobin: 15 g/dL (ref 13.0–17.0)
Immature Granulocytes: 0 %
Lymphocytes Relative: 26 %
Lymphs Abs: 2.7 10*3/uL (ref 0.7–4.0)
MCH: 26.6 pg (ref 26.0–34.0)
MCHC: 32.1 g/dL (ref 30.0–36.0)
MCV: 82.9 fL (ref 80.0–100.0)
Monocytes Absolute: 0.9 10*3/uL (ref 0.1–1.0)
Monocytes Relative: 8 %
Neutro Abs: 7 10*3/uL (ref 1.7–7.7)
Neutrophils Relative %: 66 %
Platelets: 257 10*3/uL (ref 150–400)
RBC: 5.63 MIL/uL (ref 4.22–5.81)
RDW: 14.7 % (ref 11.5–15.5)
WBC: 10.7 10*3/uL — ABNORMAL HIGH (ref 4.0–10.5)
nRBC: 0 % (ref 0.0–0.2)

## 2019-10-17 MED ORDER — FENTANYL 2500MCG IN NS 250ML (10MCG/ML) PREMIX INFUSION
100.0000 ug/h | INTRAVENOUS | Status: DC
  Administered 2019-10-18: 100 ug/h via INTRAVENOUS
  Administered 2019-10-18: 50 ug/h via INTRAVENOUS
  Filled 2019-10-17: qty 250

## 2019-10-17 MED ORDER — NALOXONE HCL 2 MG/2ML IJ SOSY
PREFILLED_SYRINGE | INTRAMUSCULAR | Status: AC
  Administered 2019-10-17: 1 mg via INTRAVENOUS
  Filled 2019-10-17: qty 2

## 2019-10-17 MED ORDER — ETOMIDATE 2 MG/ML IV SOLN
INTRAVENOUS | Status: AC | PRN
  Administered 2019-10-17: 20 mg via INTRAVENOUS

## 2019-10-17 MED ORDER — PROPOFOL 1000 MG/100ML IV EMUL
INTRAVENOUS | Status: AC
  Filled 2019-10-17: qty 100

## 2019-10-17 MED ORDER — NALOXONE HCL 2 MG/2ML IJ SOSY: 1.0000 mg | PREFILLED_SYRINGE | Freq: Once | INTRAMUSCULAR | Status: AC

## 2019-10-17 MED ORDER — ROCURONIUM BROMIDE 50 MG/5ML IV SOLN
INTRAVENOUS | Status: AC | PRN
  Administered 2019-10-17: 100 mg via INTRAVENOUS

## 2019-10-17 MED ORDER — PROPOFOL 1000 MG/100ML IV EMUL
INTRAVENOUS | Status: AC | PRN
  Administered 2019-10-17: 20 ug/kg/min via INTRAVENOUS
  Administered 2019-10-18 (×2): 10 mg via INTRAVENOUS
  Administered 2019-10-18: 100 mg via INTRAVENOUS

## 2019-10-18 ENCOUNTER — Emergency Department: Payer: Self-pay

## 2019-10-18 ENCOUNTER — Encounter: Payer: Self-pay | Admitting: Internal Medicine

## 2019-10-18 ENCOUNTER — Other Ambulatory Visit: Payer: Self-pay

## 2019-10-18 DIAGNOSIS — J9601 Acute respiratory failure with hypoxia: Secondary | ICD-10-CM

## 2019-10-18 DIAGNOSIS — T17908A Unspecified foreign body in respiratory tract, part unspecified causing other injury, initial encounter: Secondary | ICD-10-CM | POA: Diagnosis present

## 2019-10-18 DIAGNOSIS — R200 Anesthesia of skin: Secondary | ICD-10-CM

## 2019-10-18 DIAGNOSIS — A749 Chlamydial infection, unspecified: Secondary | ICD-10-CM | POA: Diagnosis present

## 2019-10-18 DIAGNOSIS — G9341 Metabolic encephalopathy: Secondary | ICD-10-CM | POA: Diagnosis present

## 2019-10-18 DIAGNOSIS — N390 Urinary tract infection, site not specified: Secondary | ICD-10-CM | POA: Diagnosis present

## 2019-10-18 DIAGNOSIS — A549 Gonococcal infection, unspecified: Secondary | ICD-10-CM | POA: Diagnosis present

## 2019-10-18 LAB — COMPREHENSIVE METABOLIC PANEL WITH GFR
ALT: 12 U/L (ref 0–44)
ALT: 13 U/L (ref 0–44)
AST: 17 U/L (ref 15–41)
AST: 25 U/L (ref 15–41)
Albumin: 3.9 g/dL (ref 3.5–5.0)
Albumin: 4.4 g/dL (ref 3.5–5.0)
Alkaline Phosphatase: 59 U/L (ref 38–126)
Alkaline Phosphatase: 75 U/L (ref 38–126)
Anion gap: 11 (ref 5–15)
Anion gap: 8 (ref 5–15)
BUN: 16 mg/dL (ref 6–20)
BUN: 18 mg/dL (ref 6–20)
CO2: 23 mmol/L (ref 22–32)
CO2: 23 mmol/L (ref 22–32)
Calcium: 8.8 mg/dL — ABNORMAL LOW (ref 8.9–10.3)
Calcium: 9.6 mg/dL (ref 8.9–10.3)
Chloride: 104 mmol/L (ref 98–111)
Chloride: 111 mmol/L (ref 98–111)
Creatinine, Ser: 0.95 mg/dL (ref 0.61–1.24)
Creatinine, Ser: 1.2 mg/dL (ref 0.61–1.24)
GFR calc Af Amer: >60 mL/min (ref >60)
GFR calc Af Amer: >60 mL/min (ref >60)
GFR calc non Af Amer: >60 mL/min (ref >60)
GFR calc non Af Amer: >60 mL/min (ref >60)
Glucose, Bld: 100 mg/dL — ABNORMAL HIGH (ref 70–99)
Glucose, Bld: 99 mg/dL (ref 70–99)
Potassium: 3.4 mmol/L — ABNORMAL LOW (ref 3.5–5.1)
Potassium: 5.3 mmol/L — ABNORMAL HIGH (ref 3.5–5.1)
Sodium: 138 mmol/L (ref 135–145)
Sodium: 142 mmol/L (ref 135–145)
Total Bilirubin: 0.6 mg/dL (ref 0.3–1.2)
Total Bilirubin: 1.2 mg/dL (ref 0.3–1.2)
Total Protein: 6.3 g/dL — ABNORMAL LOW (ref 6.5–8.1)
Total Protein: 7.9 g/dL (ref 6.5–8.1)

## 2019-10-18 LAB — URINALYSIS, ROUTINE W REFLEX MICROSCOPIC
Bacteria, UA: NONE SEEN
Bilirubin Urine: NEGATIVE
Glucose, UA: NEGATIVE mg/dL
Hgb urine dipstick: NEGATIVE
Ketones, ur: NEGATIVE mg/dL
Nitrite: NEGATIVE
Protein, ur: 30 mg/dL — AB
Specific Gravity, Urine: 1.033 — ABNORMAL HIGH (ref 1.005–1.030)
Squamous Epithelial / HPF: NONE SEEN (ref 0–5)
WBC, UA: >50 WBC/hpf — ABNORMAL HIGH (ref 0–5)
pH: 5 (ref 5.0–8.0)

## 2019-10-18 LAB — GLUCOSE, CAPILLARY: Glucose-Capillary: 76 mg/dL (ref 70–99)

## 2019-10-18 LAB — MRSA PCR SCREENING: MRSA by PCR: NEGATIVE

## 2019-10-18 LAB — ACETAMINOPHEN LEVEL: Acetaminophen (Tylenol), Serum: <10 ug/mL — ABNORMAL LOW (ref 10–30)

## 2019-10-18 LAB — CBC
HCT: 39.3 % (ref 39.0–52.0)
Hemoglobin: 12.7 g/dL — ABNORMAL LOW (ref 13.0–17.0)
MCH: 26.8 pg (ref 26.0–34.0)
MCHC: 32.3 g/dL (ref 30.0–36.0)
MCV: 83.1 fL (ref 80.0–100.0)
Platelets: 230 10*3/uL (ref 150–400)
RBC: 4.73 MIL/uL (ref 4.22–5.81)
RDW: 14.6 % (ref 11.5–15.5)
WBC: 8.1 10*3/uL (ref 4.0–10.5)
nRBC: 0 % (ref 0.0–0.2)

## 2019-10-18 LAB — BLOOD GAS, ARTERIAL
Acid-Base Excess: 2.4 mmol/L — ABNORMAL HIGH (ref 0.0–2.0)
Acid-base deficit: 0.8 mmol/L (ref 0.0–2.0)
Bicarbonate: 24.9 mmol/L (ref 20.0–28.0)
Bicarbonate: 25 mmol/L (ref 20.0–28.0)
FIO2: 0.4
FIO2: 0.5
MECHVT: 450 mL
MECHVT: 450 mL
O2 Saturation: 99.2 %
O2 Saturation: 99.7 %
PEEP: 10 cmH2O
PEEP: 5 cmH2O
Patient temperature: 37
Patient temperature: 37
RATE: 14 {breaths}/min
RATE: 18 {breaths}/min
pCO2 arterial: 32 mmHg (ref 32.0–48.0)
pCO2 arterial: 44 mmHg (ref 32.0–48.0)
pH, Arterial: 7.36 (ref 7.350–7.450)
pH, Arterial: 7.5 — ABNORMAL HIGH (ref 7.350–7.450)
pO2, Arterial: 147 mmHg — ABNORMAL HIGH (ref 83.0–108.0)
pO2, Arterial: 181 mmHg — ABNORMAL HIGH (ref 83.0–108.0)

## 2019-10-18 LAB — CHLAMYDIA/NGC RT PCR (ARMC ONLY)
Chlamydia Tr: DETECTED — AB
N gonorrhoeae: DETECTED — AB

## 2019-10-18 LAB — LIPASE, BLOOD: Lipase: 20 U/L (ref 11–51)

## 2019-10-18 LAB — URINE DRUG SCREEN, QUALITATIVE (ARMC ONLY)
Amphetamines, Ur Screen: NOT DETECTED
Barbiturates, Ur Screen: NOT DETECTED
Benzodiazepine, Ur Scrn: NOT DETECTED
Cannabinoid 50 Ng, Ur ~~LOC~~: POSITIVE — AB
Cocaine Metabolite,Ur ~~LOC~~: NOT DETECTED
MDMA (Ecstasy)Ur Screen: NOT DETECTED
Methadone Scn, Ur: NOT DETECTED
Opiate, Ur Screen: NOT DETECTED
Phencyclidine (PCP) Ur S: NOT DETECTED
Tricyclic, Ur Screen: NOT DETECTED

## 2019-10-18 LAB — TRIGLYCERIDES: Triglycerides: 83 mg/dL (ref <150)

## 2019-10-18 LAB — AMMONIA: Ammonia: 26 umol/L (ref 9–35)

## 2019-10-18 LAB — ETHANOL: Alcohol, Ethyl (B): <10 mg/dL (ref <10)

## 2019-10-18 LAB — RAPID HIV SCREEN (HIV 1/2 AB+AG)
HIV 1/2 Antibodies: NONREACTIVE
HIV-1 P24 Antigen - HIV24: NONREACTIVE

## 2019-10-18 LAB — HEPATITIS C ANTIBODY: HCV Ab: NONREACTIVE

## 2019-10-18 LAB — STREP PNEUMONIAE URINARY ANTIGEN: Strep Pneumo Urinary Antigen: NEGATIVE

## 2019-10-18 LAB — SALICYLATE LEVEL: Salicylate Lvl: <7 mg/dL — ABNORMAL LOW (ref 7.0–30.0)

## 2019-10-18 LAB — CKMB (ARMC ONLY): CK, MB: 1.5 ng/mL (ref 0.5–5.0)

## 2019-10-18 LAB — LACTIC ACID, PLASMA: Lactic Acid, Venous: 2.5 mmol/L (ref 0.5–1.9)

## 2019-10-18 LAB — RESPIRATORY PANEL BY RT PCR (FLU A&B, COVID)
Influenza A by PCR: NEGATIVE
Influenza B by PCR: NEGATIVE
SARS Coronavirus 2 by RT PCR: NEGATIVE

## 2019-10-18 LAB — MAGNESIUM: Magnesium: 2.4 mg/dL (ref 1.7–2.4)

## 2019-10-18 LAB — PROCALCITONIN: Procalcitonin: <0.1 ng/mL

## 2019-10-18 MED ORDER — SENNOSIDES-DOCUSATE SODIUM 8.6-50 MG PO TABS: 2.0000 | ORAL_TABLET | Freq: Two times a day (BID) | ORAL | Status: DC

## 2019-10-18 MED ORDER — DOCUSATE SODIUM 100 MG PO CAPS: 100.0000 mg | ORAL_CAPSULE | Freq: Two times a day (BID) | ORAL | Status: DC | PRN

## 2019-10-18 MED ORDER — MIDAZOLAM HCL 2 MG/2ML IJ SOLN: 2.0000 mg | INTRAMUSCULAR | Status: DC | PRN

## 2019-10-18 MED ORDER — POTASSIUM CHLORIDE IN NACL 20-0.9 MEQ/L-% IV SOLN
INTRAVENOUS | Status: DC
  Filled 2019-10-18 (×5): qty 1000

## 2019-10-18 MED ORDER — FAMOTIDINE IN NACL 20-0.9 MG/50ML-% IV SOLN
20.0000 mg | Freq: Two times a day (BID) | INTRAVENOUS | Status: DC
  Administered 2019-10-18: 20 mg via INTRAVENOUS
  Filled 2019-10-18: qty 50

## 2019-10-18 MED ORDER — SODIUM CHLORIDE 0.9 % IV SOLN
100.0000 mg | Freq: Two times a day (BID) | INTRAVENOUS | Status: DC
  Administered 2019-10-18: 100 mg via INTRAVENOUS
  Filled 2019-10-18 (×3): qty 100

## 2019-10-18 MED ORDER — DEXMEDETOMIDINE HCL IN NACL 400 MCG/100ML IV SOLN
0.4000 ug/kg/h | INTRAVENOUS | Status: DC
  Administered 2019-10-18: 0.8 ug/kg/h via INTRAVENOUS

## 2019-10-18 MED ORDER — PROPOFOL 1000 MG/100ML IV EMUL: 5.0000 ug/kg/min | INTRAVENOUS | Status: DC

## 2019-10-18 MED ORDER — SODIUM CHLORIDE 0.9 % IV BOLUS
1000.0000 mL | Freq: Once | INTRAVENOUS | Status: AC
  Administered 2019-10-18: 1000 mL via INTRAVENOUS

## 2019-10-18 MED ORDER — CHLORHEXIDINE GLUCONATE 0.12% ORAL RINSE (MEDLINE KIT)
15.0000 mL | Freq: Two times a day (BID) | OROMUCOSAL | Status: DC
  Administered 2019-10-18 (×2): 15 mL via OROMUCOSAL

## 2019-10-18 MED ORDER — ENOXAPARIN SODIUM 40 MG/0.4ML ~~LOC~~ SOLN
40.0000 mg | SUBCUTANEOUS | Status: DC
  Administered 2019-10-18: 40 mg via SUBCUTANEOUS
  Filled 2019-10-18: qty 0.4

## 2019-10-18 MED ORDER — ONDANSETRON HCL 4 MG/2ML IJ SOLN: 4.0000 mg | Freq: Four times a day (QID) | INTRAMUSCULAR | Status: DC | PRN

## 2019-10-18 MED ORDER — FENTANYL BOLUS VIA INFUSION
50.0000 ug | INTRAVENOUS | Status: DC | PRN
  Filled 2019-10-18: qty 50

## 2019-10-18 MED ORDER — MIDAZOLAM HCL 2 MG/2ML IJ SOLN
4.0000 mg | Freq: Once | INTRAMUSCULAR | Status: AC
  Administered 2019-10-18: 4 mg via INTRAVENOUS

## 2019-10-18 MED ORDER — ACETAMINOPHEN 325 MG PO TABS: 650.0000 mg | ORAL_TABLET | ORAL | Status: DC | PRN

## 2019-10-18 MED ORDER — POLYETHYLENE GLYCOL 3350 17 G PO PACK: 17.0000 g | PACK | Freq: Every day | ORAL | Status: DC

## 2019-10-18 MED ORDER — ORAL CARE MOUTH RINSE
15.0000 mL | OROMUCOSAL | Status: DC
  Administered 2019-10-18 – 2019-10-19 (×3): 15 mL via OROMUCOSAL

## 2019-10-18 MED ORDER — AZITHROMYCIN 200 MG/5ML PO SUSR
1000.0000 mg | Freq: Once | ORAL | Status: AC
  Administered 2019-10-18: 1000 mg via ORAL
  Filled 2019-10-18: qty 25

## 2019-10-18 MED ORDER — IPRATROPIUM-ALBUTEROL 0.5-2.5 (3) MG/3ML IN SOLN: 3.0000 mL | RESPIRATORY_TRACT | Status: DC | PRN

## 2019-10-18 MED ORDER — MIDAZOLAM HCL 2 MG/2ML IJ SOLN
2.0000 mg | INTRAMUSCULAR | Status: DC | PRN
  Administered 2019-10-18: 2 mg via INTRAVENOUS
  Filled 2019-10-18: qty 2

## 2019-10-18 MED ORDER — DOCUSATE SODIUM 50 MG/5ML PO LIQD
100.0000 mg | Freq: Two times a day (BID) | ORAL | Status: DC
  Administered 2019-10-18: 100 mg via ORAL
  Filled 2019-10-18: qty 10

## 2019-10-18 MED ORDER — CHLORHEXIDINE GLUCONATE CLOTH 2 % EX PADS
6.0000 | MEDICATED_PAD | Freq: Every day | CUTANEOUS | Status: DC
  Administered 2019-10-18: 6 via TOPICAL
  Filled 2019-10-18: qty 6

## 2019-10-18 MED ORDER — SODIUM CHLORIDE 0.9 % IV SOLN: INTRAVENOUS | Status: DC

## 2019-10-18 MED ORDER — CEFTRIAXONE SODIUM 500 MG IJ SOLR
500.0000 mg | Freq: Once | INTRAMUSCULAR | Status: AC
  Administered 2019-10-18: 500 mg via INTRAMUSCULAR
  Filled 2019-10-18: qty 500

## 2019-10-18 MED ORDER — POLYETHYLENE GLYCOL 3350 17 G PO PACK: 17.0000 g | PACK | Freq: Every day | ORAL | Status: DC | PRN

## 2019-10-18 MED ORDER — PROPOFOL 1000 MG/100ML IV EMUL
INTRAVENOUS | Status: AC
  Administered 2019-10-18: 50 ug/kg/min via INTRAVENOUS
  Filled 2019-10-18: qty 100

## 2019-10-19 DIAGNOSIS — A549 Gonococcal infection, unspecified: Secondary | ICD-10-CM

## 2019-10-19 DIAGNOSIS — A749 Chlamydial infection, unspecified: Secondary | ICD-10-CM

## 2019-10-19 LAB — CBC WITH DIFFERENTIAL/PLATELET
Abs Immature Granulocytes: 0.02 10*3/uL (ref 0.00–0.07)
Basophils Absolute: 0 10*3/uL (ref 0.0–0.1)
Basophils Relative: 0 %
Eosinophils Absolute: 0.2 10*3/uL (ref 0.0–0.5)
Eosinophils Relative: 2 %
HCT: 39.3 % (ref 39.0–52.0)
Hemoglobin: 12.7 g/dL — ABNORMAL LOW (ref 13.0–17.0)
Immature Granulocytes: 0 %
Lymphocytes Relative: 30 %
Lymphs Abs: 2.9 10*3/uL (ref 0.7–4.0)
MCH: 27 pg (ref 26.0–34.0)
MCHC: 32.3 g/dL (ref 30.0–36.0)
MCV: 83.6 fL (ref 80.0–100.0)
Monocytes Absolute: 0.8 10*3/uL (ref 0.1–1.0)
Monocytes Relative: 9 %
Neutro Abs: 5.7 10*3/uL (ref 1.7–7.7)
Neutrophils Relative %: 59 %
Platelets: 239 10*3/uL (ref 150–400)
RBC: 4.7 MIL/uL (ref 4.22–5.81)
RDW: 14.7 % (ref 11.5–15.5)
WBC: 9.6 10*3/uL (ref 4.0–10.5)
nRBC: 0 % (ref 0.0–0.2)

## 2019-10-19 LAB — COMPREHENSIVE METABOLIC PANEL WITH GFR
ALT: 10 U/L (ref 0–44)
AST: 14 U/L — ABNORMAL LOW (ref 15–41)
Albumin: 3.4 g/dL — ABNORMAL LOW (ref 3.5–5.0)
Alkaline Phosphatase: 66 U/L (ref 38–126)
Anion gap: 6 (ref 5–15)
BUN: 17 mg/dL (ref 6–20)
CO2: 24 mmol/L (ref 22–32)
Calcium: 8.6 mg/dL — ABNORMAL LOW (ref 8.9–10.3)
Chloride: 108 mmol/L (ref 98–111)
Creatinine, Ser: 1.09 mg/dL (ref 0.61–1.24)
GFR calc Af Amer: >60 mL/min (ref >60)
GFR calc non Af Amer: >60 mL/min (ref >60)
Glucose, Bld: 79 mg/dL (ref 70–99)
Potassium: 3.8 mmol/L (ref 3.5–5.1)
Sodium: 138 mmol/L (ref 135–145)
Total Bilirubin: 0.6 mg/dL (ref 0.3–1.2)
Total Protein: 6.1 g/dL — ABNORMAL LOW (ref 6.5–8.1)

## 2019-10-19 LAB — MAGNESIUM: Magnesium: 2.1 mg/dL (ref 1.7–2.4)

## 2019-10-19 LAB — HEPATITIS B SURFACE ANTIBODY, QUANTITATIVE: Hep B S AB Quant (Post): <3.1 m[IU]/mL — ABNORMAL LOW (ref >9.9)

## 2019-10-19 LAB — PROCALCITONIN: Procalcitonin: <0.1 ng/mL

## 2019-10-19 LAB — LEGIONELLA PNEUMOPHILA SEROGP 1 UR AG: L. pneumophila Serogp 1 Ur Ag: NEGATIVE

## 2019-10-23 ENCOUNTER — Telehealth: Payer: Self-pay | Admitting: Internal Medicine

## 2019-10-23 LAB — CULTURE, BLOOD (ROUTINE X 2)
Culture: NO GROWTH
Culture: NO GROWTH
Special Requests: ADEQUATE
Special Requests: ADEQUATE

## 2019-10-24 ENCOUNTER — Other Ambulatory Visit: Payer: Self-pay

## 2019-10-24 ENCOUNTER — Encounter: Payer: Self-pay | Admitting: Cardiology

## 2019-10-24 ENCOUNTER — Encounter: Payer: Self-pay | Admitting: *Deleted

## 2019-10-24 ENCOUNTER — Ambulatory Visit (INDEPENDENT_AMBULATORY_CARE_PROVIDER_SITE_OTHER): Payer: Self-pay | Admitting: Cardiology

## 2019-10-24 ENCOUNTER — Ambulatory Visit (INDEPENDENT_AMBULATORY_CARE_PROVIDER_SITE_OTHER): Payer: Self-pay

## 2019-10-24 VITALS — BP 108/70 | HR 61 | Ht 67.0 in | Wt 152.2 lb

## 2019-10-24 DIAGNOSIS — R55 Syncope and collapse: Secondary | ICD-10-CM

## 2019-10-24 NOTE — Progress Notes (Signed)
Cardiology Office Note:    Date:  10/24/2019   ID:  Jay Moran, DOB 12/08/1990, MRN [id number]  PCP:  Patient, No Pcp Per  Cardiologist:  Kate Sable, MD  Electrophysiologist:  None   Referring MD: Flora Lipps, MD   Chief Complaint  Patient presents with  . OTHER    Hx Syncope c/o rapid heart beat. Meds reviewed verbally with pt.    History of Present Illness:    Jay Moran is a 29 y.o. male with no significant past medical history who presents due to syncope.  Patient was admitted to the hospital about a week ago on 10/17/2019 with acute onset syncope.  Patient was stressed and arguing with his wife when symptoms occurred.  He states feeling chest discomfort, rapid heart rates prior to passing out.  He was brought to [facility] ED per EMS.  On admit, UDS was positive for marijuana, alcohol levels were within normal limits.  Patient was intubated for airway protection.  Diagnosed with metabolic encephalopathy.  Head CT was within normal limits.  Patient was extubated next day follow-up recommended.  States having prior episodes of syncope years ago while incarcerated.  His symptoms usually come about when he is severely stressed.  He denies any history of heart disease.  Denies recreational drug use such as meth, cocaine, heroin.  He smokes marijuana only.  Patient in the room today with wife who states whenever she talks about living patient, or when patient is extremely stressed, he passes out.  He has had about 4 similar episodes over the past several years total.  History reviewed. No pertinent past medical history.  Past Surgical History:  Procedure Laterality Date  . STOMACH SURGERY      Current Medications: No outpatient medications have been marked as taking for the 10/24/19 encounter (Office Visit) with Kate Sable, MD.     Allergies:   Patient has no known allergies.   Social History   Socioeconomic History  . Marital status: Unknown    Spouse  name: Not on file  . Number of children: Not on file  . Years of education: Not on file  . Highest education level: Not on file  Occupational History  . Not on file  Tobacco Use  . Smoking status: Light Tobacco Smoker    Years: 19.00    Types: Cigarettes  . Smokeless tobacco: Never Used  Substance and Sexual Activity  . Alcohol use: Not Currently  . Drug use: Yes    Types: Marijuana  . Sexual activity: Not on file  Other Topics Concern  . Not on file  Social History Narrative  . Not on file   Social Determinants of Health   Financial Resource Strain:   . Difficulty of Paying Living Expenses:   Food Insecurity:   . Worried About Charity fundraiser in the Last Year:   . Arboriculturist in the Last Year:   Transportation Needs:   . Film/video editor (Medical):   Marland Kitchen Lack of Transportation (Non-Medical):   Physical Activity:   . Days of Exercise per Week:   . Minutes of Exercise per Session:   Stress:   . Feeling of Stress :   Social Connections:   . Frequency of Communication with Friends and Family:   . Frequency of Social Gatherings with Friends and Family:   . Attends Religious Services:   . Active Member of Clubs or Organizations:   . Attends Archivist Meetings:   .  Marital Status:      Family History: Denies any family history of cardiac disease.  ROS:   Please see the history of present illness.     All other systems reviewed and are negative.  EKGs/Labs/Other Studies Reviewed:    The following studies were reviewed today:   EKG:  EKG is  ordered today.  The ekg ordered today demonstrates sinus rhythm, normal ECG.  Recent Labs: 10/19/2019: ALT 10; BUN 17; Creatinine, Ser 1.09; Hemoglobin 12.7; Magnesium 2.1; Platelets 239; Potassium 3.8; Sodium 138  Recent Lipid Panel    Component Value Date/Time   TRIG 83 10/18/2019 0344    Physical Exam:    VS:  BP 108/70 (BP Location: Right Arm, Patient Position: Sitting, Cuff Size: Normal)    Pulse 61   Ht 5\' 7"  (1.702 m)   Wt 152 lb 4 oz (69.1 kg)   SpO2 98%   BMI 23.85 kg/m     Wt Readings from Last 3 Encounters:  10/24/19 152 lb 4 oz (69.1 kg)  10/19/19 147 lb 7.8 oz (66.9 kg)     GEN:  Well nourished, well developed in no acute distress HEENT: Normal NECK: No JVD; No carotid bruits LYMPHATICS: No lymphadenopathy CARDIAC: RRR, no murmurs, rubs, gallops RESPIRATORY:  Clear to auscultation without rales, wheezing or rhonchi  ABDOMEN: Soft, non-tender, non-distended MUSCULOSKELETAL:  No edema; No deformity  SKIN: Warm and dry NEUROLOGIC:  Alert and oriented x 3 PSYCHIATRIC:  Normal affect   ASSESSMENT:    1. Syncope and collapse    PLAN:    In order of problems listed above:  1. Patient with episodes of syncope.  Symptoms of palpitations and chest discomfort proceed syncopal episodes.  There is usually an underlying stress factor.  Due to palpitations prior to syncope, will evaluate with cardiac monitor and echocardiogram to rule out any cardiac etiology.  Patient's history/trigger being when stressed or in extreme emotional distress fits criteria for vasovagal syncope.  Stressful/triggers avoidance recommended to patient.  Follow-up after echo and cardiac monitor.  This note was generated in part or whole with voice recognition software. Voice recognition is usually quite accurate but there are transcription errors that can and very often do occur. I apologize for any typographical errors that were not detected and corrected.  Medication Adjustments/Labs and Tests Ordered: Current medicines are reviewed at length with the patient today.  Concerns regarding medicines are outlined above.  Orders Placed This Encounter  Procedures  . LONG TERM MONITOR (3-14 DAYS)  . ECHOCARDIOGRAM COMPLETE   No orders of the defined types were placed in this encounter.   Patient Instructions  Medication Instructions:  No changes  *If you need a refill on your cardiac  medications before your next appointment, please call your pharmacy*   Lab Work: None  If you have labs (blood work) drawn today and your tests are completely normal, you will receive your results only by: 12/19/19 MyChart Message (if you have MyChart) OR . A paper copy in the mail If you have any lab test that is abnormal or we need to change your treatment, we will call you to review the results.   Testing/Procedures: Your physician has requested that you have an echocardiogram. Echocardiography is a painless test that uses sound waves to create images of your heart. It provides your doctor with information about the size and shape of your heart and how well your heart's chambers and valves are working. This procedure takes approximately one hour. There  are no restrictions for this procedure.  Your physician has recommended that you wear a Zio monitor. This monitor is a medical device that records the heart's electrical activity. Doctors most often use these monitors to diagnose arrhythmias. Arrhythmias are problems with the speed or rhythm of the heartbeat. The monitor is a small device applied to your chest. You can wear one while you do your normal daily activities. While wearing this monitor if you have any symptoms to push the button and record what you felt. Once you have worn this monitor for the period of time provider prescribed (Usually 14 days), you will return the monitor device in the postage paid box. Once it is returned they will download the data collected and provide [place] with a report which the provider will then review and we will call you with those results. Important tips:  1. Avoid showering during the first 24 hours of wearing the monitor. 2. Avoid excessive sweating to help maximize wear time. 3. Do not submerge the device, no hot tubs, and no swimming pools. 4. Keep any lotions or oils away from the patch. 5. After 24 hours you may shower with the patch on. Take brief showers  with your back facing the shower head.  6. Do not remove patch once it has been placed because that will interrupt data and decrease adhesive wear time. 7. Push the button when you have any symptoms and write down what you were feeling. 8. Once you have completed wearing your monitor, remove and place into box which has postage paid and place in your outgoing mailbox.  9. If for some reason you have misplaced your box then call our office and we can provide another box and/or mail it off for you.       Follow-Up: At [facility], you and your health needs are our priority.  As part of our continuing mission to provide you with exceptional heart care, we have created designated Provider Care Teams.  These Care Teams include your primary Cardiologist (physician) and Advanced Practice Providers (APPs -  Physician Assistants and Nurse Practitioners) who all work together to provide you with the care you need, when you need it.  We recommend signing up for the patient portal called "MyChart".  Sign up information is provided on this After Visit Summary.  MyChart is used to connect with patients for Virtual Visits (Telemedicine).  Patients are able to view lab/test results, encounter notes, upcoming appointments, etc.  Non-urgent messages can be sent to your provider as well.   To learn more about what you can do with MyChart, go to [email or web address].    Your next appointment:   Follow up after testing  The format for your next appointment:   In Person  Provider:    You may see Debbe Odea, MD or one of the following Advanced Practice Providers on your designated Care Team:    Nicolasa Ducking, NP  Eula Listen, PA-C  Marisue Ivan, PA-C      Signed, Debbe Odea, MD  10/24/2019 10:58 AM    [facility]

## 2019-11-26 ENCOUNTER — Telehealth: Payer: Self-pay | Admitting: *Deleted

## 2019-11-28 ENCOUNTER — Other Ambulatory Visit: Payer: Self-pay

## 2019-11-29 ENCOUNTER — Encounter: Payer: Self-pay | Admitting: *Deleted

## 2019-12-05 ENCOUNTER — Ambulatory Visit: Payer: Self-pay | Admitting: Cardiology

## 2019-12-06 ENCOUNTER — Encounter: Payer: Self-pay | Admitting: Cardiology

## 2022-08-16 ENCOUNTER — Other Ambulatory Visit: Payer: Self-pay

## 2022-08-16 ENCOUNTER — Emergency Department (HOSPITAL_COMMUNITY)
Admission: EM | Admit: 2022-08-16 | Discharge: 2022-08-16 | Disposition: A | Discharge status: Home / Self Care | Payer: Self-pay | Attending: Emergency Medicine | Admitting: Emergency Medicine

## 2022-08-16 ENCOUNTER — Encounter (HOSPITAL_COMMUNITY): Payer: Self-pay

## 2022-08-16 DIAGNOSIS — T401X4A Poisoning by heroin, undetermined, initial encounter: Secondary | ICD-10-CM | POA: Insufficient documentation

## 2022-08-16 DIAGNOSIS — T50904A Poisoning by unspecified drugs, medicaments and biological substances, undetermined, initial encounter: Secondary | ICD-10-CM

## 2022-08-16 HISTORY — DX: Unspecified asthma, uncomplicated: J45.909

## 2022-08-16 LAB — CBC WITH DIFFERENTIAL/PLATELET
Abs Immature Granulocytes: 0.21 10*3/uL — ABNORMAL HIGH (ref 0.00–0.07)
Basophils Absolute: 0 10*3/uL (ref 0.0–0.1)
Basophils Relative: 0 %
Eosinophils Absolute: 0 10*3/uL (ref 0.0–0.5)
Eosinophils Relative: 0 %
HCT: 39.3 % (ref 39.0–52.0)
Hemoglobin: 12.5 g/dL — ABNORMAL LOW (ref 13.0–17.0)
Immature Granulocytes: 2 %
Lymphocytes Relative: 13 %
Lymphs Abs: 1.6 10*3/uL (ref 0.7–4.0)
MCH: 26.9 pg (ref 26.0–34.0)
MCHC: 31.8 g/dL (ref 30.0–36.0)
MCV: 84.7 fL (ref 80.0–100.0)
Monocytes Absolute: 0.8 10*3/uL (ref 0.1–1.0)
Monocytes Relative: 7 %
Neutro Abs: 9.4 10*3/uL — ABNORMAL HIGH (ref 1.7–7.7)
Neutrophils Relative %: 78 %
Platelets: 401 10*3/uL — ABNORMAL HIGH (ref 150–400)
RBC: 4.64 MIL/uL (ref 4.22–5.81)
RDW: 16.2 % — ABNORMAL HIGH (ref 11.5–15.5)
WBC: 12 10*3/uL — ABNORMAL HIGH (ref 4.0–10.5)
nRBC: 0 % (ref 0.0–0.2)

## 2022-08-16 LAB — COMPREHENSIVE METABOLIC PANEL WITH GFR
ALT: 12 U/L (ref 0–44)
AST: 16 U/L (ref 15–41)
Albumin: 4.4 g/dL (ref 3.5–5.0)
Alkaline Phosphatase: 63 U/L (ref 38–126)
Anion gap: 8 (ref 5–15)
BUN: 11 mg/dL (ref 6–20)
CO2: 26 mmol/L (ref 22–32)
Calcium: 9.4 mg/dL (ref 8.9–10.3)
Chloride: 105 mmol/L (ref 98–111)
Creatinine, Ser: 0.87 mg/dL (ref 0.61–1.24)
GFR, Estimated: >60 mL/min (ref >60)
Glucose, Bld: 92 mg/dL (ref 70–99)
Potassium: 4 mmol/L (ref 3.5–5.1)
Sodium: 139 mmol/L (ref 135–145)
Total Bilirubin: 0.7 mg/dL (ref 0.3–1.2)
Total Protein: 7.1 g/dL (ref 6.5–8.1)

## 2022-08-16 LAB — ACETAMINOPHEN LEVEL: Acetaminophen (Tylenol), Serum: <10 ug/mL — ABNORMAL LOW (ref 10–30)

## 2022-08-16 LAB — RAPID URINE DRUG SCREEN, HOSP PERFORMED
Amphetamines: NOT DETECTED
Barbiturates: NOT DETECTED
Benzodiazepines: NOT DETECTED
Cocaine: NOT DETECTED
Opiates: NOT DETECTED
Tetrahydrocannabinol: POSITIVE — AB

## 2022-08-16 LAB — CBG MONITORING, ED: Glucose-Capillary: 90 mg/dL (ref 70–99)

## 2022-08-16 LAB — ETHANOL: Alcohol, Ethyl (B): <10 mg/dL (ref <10)

## 2022-08-16 LAB — SALICYLATE LEVEL: Salicylate Lvl: <7 mg/dL — ABNORMAL LOW (ref 7.0–30.0)

## 2022-08-16 MED ORDER — SODIUM CHLORIDE 0.9 % IV BOLUS
1000.0000 mL | Freq: Once | INTRAVENOUS | Status: AC
  Administered 2022-08-16: 1000 mL via INTRAVENOUS

## 2022-12-22 ENCOUNTER — Emergency Department (HOSPITAL_COMMUNITY): Payer: Self-pay

## 2022-12-22 ENCOUNTER — Encounter (HOSPITAL_COMMUNITY): Payer: Self-pay

## 2022-12-22 ENCOUNTER — Emergency Department (HOSPITAL_COMMUNITY)
Admission: EM | Admit: 2022-12-22 | Discharge: 2022-12-22 | Disposition: A | Discharge status: Home / Self Care | Payer: Self-pay | Attending: Emergency Medicine | Admitting: Emergency Medicine

## 2022-12-22 ENCOUNTER — Other Ambulatory Visit: Payer: Self-pay

## 2022-12-22 DIAGNOSIS — N451 Epididymitis: Secondary | ICD-10-CM | POA: Insufficient documentation

## 2022-12-22 DIAGNOSIS — K920 Hematemesis: Secondary | ICD-10-CM | POA: Insufficient documentation

## 2022-12-22 DIAGNOSIS — J45909 Unspecified asthma, uncomplicated: Secondary | ICD-10-CM | POA: Insufficient documentation

## 2022-12-22 DIAGNOSIS — A64 Unspecified sexually transmitted disease: Secondary | ICD-10-CM | POA: Insufficient documentation

## 2022-12-22 DIAGNOSIS — K921 Melena: Secondary | ICD-10-CM

## 2022-12-22 LAB — CBC WITH DIFFERENTIAL/PLATELET
Abs Immature Granulocytes: 0.01 10*3/uL (ref 0.00–0.07)
Basophils Absolute: 0 10*3/uL (ref 0.0–0.1)
Basophils Relative: 0 %
Eosinophils Absolute: 0 10*3/uL (ref 0.0–0.5)
Eosinophils Relative: 0 %
HCT: 43.1 % (ref 39.0–52.0)
Hemoglobin: 13.8 g/dL (ref 13.0–17.0)
Immature Granulocytes: 0 %
Lymphocytes Relative: 18 %
Lymphs Abs: 1.6 10*3/uL (ref 0.7–4.0)
MCH: 27.5 pg (ref 26.0–34.0)
MCHC: 32 g/dL (ref 30.0–36.0)
MCV: 85.9 fL (ref 80.0–100.0)
Monocytes Absolute: 0.7 10*3/uL (ref 0.1–1.0)
Monocytes Relative: 8 %
Neutro Abs: 6.6 10*3/uL (ref 1.7–7.7)
Neutrophils Relative %: 74 %
Platelets: 303 10*3/uL (ref 150–400)
RBC: 5.02 MIL/uL (ref 4.22–5.81)
RDW: 14.7 % (ref 11.5–15.5)
WBC: 8.9 10*3/uL (ref 4.0–10.5)
nRBC: 0 % (ref 0.0–0.2)

## 2022-12-22 LAB — URINALYSIS, MICROSCOPIC (REFLEX)

## 2022-12-22 LAB — PROTIME-INR
INR: 1.2 (ref 0.8–1.2)
Prothrombin Time: 15.1 s (ref 11.4–15.2)

## 2022-12-22 LAB — COMPREHENSIVE METABOLIC PANEL WITH GFR
ALT: 13 U/L (ref 0–44)
AST: 17 U/L (ref 15–41)
Albumin: 3.8 g/dL (ref 3.5–5.0)
Alkaline Phosphatase: 60 U/L (ref 38–126)
Anion gap: 10 (ref 5–15)
BUN: 10 mg/dL (ref 6–20)
CO2: 25 mmol/L (ref 22–32)
Calcium: 8.9 mg/dL (ref 8.9–10.3)
Chloride: 105 mmol/L (ref 98–111)
Creatinine, Ser: 1.09 mg/dL (ref 0.61–1.24)
GFR, Estimated: >60 mL/min (ref >60)
Glucose, Bld: 105 mg/dL — ABNORMAL HIGH (ref 70–99)
Potassium: 3.5 mmol/L (ref 3.5–5.1)
Sodium: 140 mmol/L (ref 135–145)
Total Bilirubin: 0.4 mg/dL (ref 0.3–1.2)
Total Protein: 6.6 g/dL (ref 6.5–8.1)

## 2022-12-22 LAB — URINALYSIS, ROUTINE W REFLEX MICROSCOPIC
Bilirubin Urine: NEGATIVE
Glucose, UA: NEGATIVE mg/dL
Hgb urine dipstick: NEGATIVE
Ketones, ur: NEGATIVE mg/dL
Nitrite: NEGATIVE
Protein, ur: NEGATIVE mg/dL
Specific Gravity, Urine: 1.02 (ref 1.005–1.030)
pH: 7 (ref 5.0–8.0)

## 2022-12-22 LAB — RAPID URINE DRUG SCREEN, HOSP PERFORMED
Amphetamines: NOT DETECTED
Barbiturates: NOT DETECTED
Benzodiazepines: NOT DETECTED
Cocaine: NOT DETECTED
Opiates: NOT DETECTED
Tetrahydrocannabinol: POSITIVE — AB

## 2022-12-22 LAB — ETHANOL: Alcohol, Ethyl (B): <10 mg/dL (ref <10)

## 2022-12-22 LAB — ABO/RH: ABO/RH(D): A POS

## 2022-12-22 LAB — LIPASE, BLOOD: Lipase: 31 U/L (ref 11–51)

## 2022-12-22 LAB — HIV ANTIBODY (ROUTINE TESTING W REFLEX): HIV Screen 4th Generation wRfx: NONREACTIVE

## 2022-12-22 MED ORDER — LACTATED RINGERS IV BOLUS
1000.0000 mL | Freq: Once | INTRAVENOUS | Status: AC
  Administered 2022-12-22: 1000 mL via INTRAVENOUS

## 2022-12-22 MED ORDER — PANTOPRAZOLE SODIUM 40 MG IV SOLR
40.0000 mg | Freq: Once | INTRAVENOUS | Status: AC
  Administered 2022-12-22: 40 mg via INTRAVENOUS
  Filled 2022-12-22: qty 10

## 2022-12-22 MED ORDER — ONDANSETRON HCL 4 MG/2ML IJ SOLN
4.0000 mg | Freq: Once | INTRAMUSCULAR | Status: AC
  Administered 2022-12-22: 4 mg via INTRAVENOUS
  Filled 2022-12-22: qty 2

## 2022-12-22 MED ORDER — ONDANSETRON HCL 4 MG PO TABS: 4.0000 mg | ORAL_TABLET | Freq: Three times a day (TID) | ORAL | 0 refills | Status: AC | PRN

## 2022-12-22 MED ORDER — SODIUM CHLORIDE 0.9 % IV SOLN
1000.0000 mg | Freq: Once | INTRAVENOUS | Status: AC
  Administered 2022-12-22: 1000 mg via INTRAVENOUS
  Filled 2022-12-22: qty 10

## 2022-12-22 MED ORDER — AZITHROMYCIN 250 MG PO TABS
1000.0000 mg | ORAL_TABLET | Freq: Once | ORAL | Status: AC
  Administered 2022-12-22: 1000 mg via ORAL
  Filled 2022-12-22: qty 4

## 2022-12-22 MED ORDER — DOXYCYCLINE HYCLATE 100 MG PO CAPS: 100.0000 mg | ORAL_CAPSULE | Freq: Two times a day (BID) | ORAL | 0 refills | Status: AC

## 2022-12-22 MED ORDER — METRONIDAZOLE 500 MG PO TABS
2000.0000 mg | ORAL_TABLET | Freq: Once | ORAL | Status: AC
  Administered 2022-12-22: 2000 mg via ORAL
  Filled 2022-12-22: qty 4

## 2022-12-23 LAB — RPR: RPR Ser Ql: NONREACTIVE

## 2023-01-08 ENCOUNTER — Encounter (HOSPITAL_COMMUNITY): Payer: Self-pay

## 2023-01-08 ENCOUNTER — Emergency Department (HOSPITAL_COMMUNITY)
Admission: EM | Admit: 2023-01-08 | Discharge: 2023-01-08 | Attending: Emergency Medicine | Admitting: Emergency Medicine

## 2023-01-08 DIAGNOSIS — R319 Hematuria, unspecified: Secondary | ICD-10-CM | POA: Insufficient documentation

## 2023-01-08 LAB — CBC WITH DIFFERENTIAL/PLATELET
Abs Immature Granulocytes: 0.01 10*3/uL (ref 0.00–0.07)
Basophils Absolute: 0 10*3/uL (ref 0.0–0.1)
Basophils Relative: 0 %
Eosinophils Absolute: 0.1 10*3/uL (ref 0.0–0.5)
Eosinophils Relative: 1 %
HCT: 40.4 % (ref 39.0–52.0)
Hemoglobin: 12.8 g/dL — ABNORMAL LOW (ref 13.0–17.0)
Immature Granulocytes: 0 %
Lymphocytes Relative: 30 %
Lymphs Abs: 2.4 10*3/uL (ref 0.7–4.0)
MCH: 27.3 pg (ref 26.0–34.0)
MCHC: 31.7 g/dL (ref 30.0–36.0)
MCV: 86.1 fL (ref 80.0–100.0)
Monocytes Absolute: 0.7 10*3/uL (ref 0.1–1.0)
Monocytes Relative: 8 %
Neutro Abs: 5 10*3/uL (ref 1.7–7.7)
Neutrophils Relative %: 61 %
Platelets: 229 10*3/uL (ref 150–400)
RBC: 4.69 MIL/uL (ref 4.22–5.81)
RDW: 14.7 % (ref 11.5–15.5)
WBC: 8.1 10*3/uL (ref 4.0–10.5)
nRBC: 0 % (ref 0.0–0.2)

## 2023-01-08 LAB — BASIC METABOLIC PANEL
Anion gap: 7 (ref 5–15)
BUN: 9 mg/dL (ref 6–20)
CO2: 27 mmol/L (ref 22–32)
Calcium: 9.2 mg/dL (ref 8.9–10.3)
Chloride: 105 mmol/L (ref 98–111)
Creatinine, Ser: 0.86 mg/dL (ref 0.61–1.24)
GFR, Estimated: >60 mL/min (ref >60)
Glucose, Bld: 91 mg/dL (ref 70–99)
Potassium: 3.7 mmol/L (ref 3.5–5.1)
Sodium: 139 mmol/L (ref 135–145)

## 2023-01-08 LAB — URINALYSIS, ROUTINE W REFLEX MICROSCOPIC
Bilirubin Urine: NEGATIVE
Glucose, UA: NEGATIVE mg/dL
Hgb urine dipstick: NEGATIVE
Ketones, ur: NEGATIVE mg/dL
Leukocytes,Ua: NEGATIVE
Nitrite: NEGATIVE
Protein, ur: NEGATIVE mg/dL
Specific Gravity, Urine: 1.008 (ref 1.005–1.030)
pH: 8 (ref 5.0–8.0)

## 2023-01-08 NOTE — Discharge Instructions (Signed)
You were evaluated today for blood in your urine and abnormal lab results.  Your laboratory results today show no blood in the urine and otherwise normal labs.  I have provided contact information for urology for follow-up as needed if you continue to see blood in your urine.

## 2023-01-08 NOTE — ED Provider Notes (Signed)
[facility] Provider Note   CSN: [id number] Arrival date & time: 01/08/23  4098     History  Chief Complaint  Patient presents with   Abnormal Lab    Jay Moran is a 32 y.o. male.  Patient presents to the emergency department accompanied by law enforcement staff from jail due to concerns about elevated potassium and hematuria.  Patient states he has had intermittent hematuria for approximately 1 month.  He also complains of occasional pain with urination.  Currently the patient is asymptomatic.  He was sent to the emergency department primarily due to hyperkalemia on outpatient labs.  Patient with past medical history significant for bipolar 1 disorder, schizophrenia, depression, ADHD.  HPI     Home Medications Prior to Admission medications   Not on File      Allergies    Patient has no known allergies.    Review of Systems   Review of Systems  Physical Exam Updated Vital Signs BP 119/85 (BP Location: Left Arm)   Pulse 80   Temp 98.4 F (36.9 C) (Oral)   Resp 18   SpO2 100%  Physical Exam Vitals and nursing note reviewed.  HENT:     Head: Normocephalic and atraumatic.     Mouth/Throat:     Mouth: Mucous membranes are moist.  Eyes:     Extraocular Movements: Extraocular movements intact.     Conjunctiva/sclera: Conjunctivae normal.  Cardiovascular:     Rate and Rhythm: Normal rate.  Pulmonary:     Effort: Pulmonary effort is normal. No respiratory distress.  Musculoskeletal:        General: No signs of injury. Normal range of motion.     Cervical back: Normal range of motion.  Skin:    General: Skin is dry.  Neurological:     Mental Status: He is alert.  Psychiatric:        Speech: Speech normal.        Behavior: Behavior normal.     ED Results / Procedures / Treatments   Labs (all labs ordered are listed, but only abnormal results are displayed) Labs Reviewed  CBC WITH DIFFERENTIAL/PLATELET -  Abnormal; Notable for the following components:      Result Value   Hemoglobin 12.8 (*)    All other components within normal limits  URINALYSIS, ROUTINE W REFLEX MICROSCOPIC - Abnormal; Notable for the following components:   Color, Urine STRAW (*)    APPearance HAZY (*)    All other components within normal limits  BASIC METABOLIC PANEL    EKG None  Radiology No results found.  Procedures Procedures    Medications Ordered in ED Medications - No data to display  ED Course/ Medical Decision Making/ A&P                             Medical Decision Making Amount and/or Complexity of Data Reviewed Labs: ordered.   This patient presents to the ED for concern of hyperkalemia, this involves an extensive number of treatment options, and is a complaint that carries with it a high risk of complications and morbidity.  The differential diagnosis includes lab error, metabolic abnormality, others Patient also complains of intermittent hematuria.   Co morbidities that complicate the patient evaluation  Bipolar 1 disorder, schizophrenia   Additional history obtained:  Additional history obtained from law enforcement  Lab Tests:  I Ordered, and personally interpreted  labs.  The pertinent results include: Hemoglobin 12.8, normal potassium, no hematuria on UA  Social Determinants of Health:  Patient is currently incarcerated   Test / Admission - Considered:  Patient's labs are unremarkable today.  Hyperkalemia was likely a lab error.  Unclear about patient's intermittent hematuria because the patient currently has no hematuria.  Will provide contact information for urology.  Patient may have outpatient urology follow-up as needed for hematuria.  Patient currently asymptomatic, plan to discharge back to law enforcement custody.         Final Clinical Impression(s) / ED Diagnoses Final diagnoses:  Hematuria, unspecified type    Rx / DC Orders ED Discharge Orders      None         Pamala Duffel 01/08/23 1102    Terald Sleeper, MD 01/08/23 1230

## 2023-01-08 NOTE — ED Triage Notes (Signed)
Pt from the jail, sent for abnormal labs. Concern for elevated K+ and hematuria.

## 2023-12-03 ENCOUNTER — Emergency Department (HOSPITAL_COMMUNITY)

## 2023-12-03 DIAGNOSIS — R258 Other abnormal involuntary movements: Secondary | ICD-10-CM | POA: Diagnosis present

## 2023-12-03 DIAGNOSIS — R682 Dry mouth, unspecified: Secondary | ICD-10-CM | POA: Insufficient documentation

## 2023-12-03 DIAGNOSIS — R569 Unspecified convulsions: Secondary | ICD-10-CM

## 2023-12-03 HISTORY — DX: Unspecified convulsions: R56.9

## 2023-12-05 ENCOUNTER — Encounter (HOSPITAL_COMMUNITY): Payer: Self-pay
